# Patient Record
Sex: Male | Born: 1965 | Race: White | Hispanic: No | Marital: Married | Smoking: Never smoker
Health system: Southern US, Community
[De-identification: ages and names within clinical notes are randomized; demographics above are authoritative.]

## PROBLEM LIST (undated history)

## (undated) DIAGNOSIS — G35D Multiple sclerosis, unspecified: Secondary | ICD-10-CM

## (undated) DIAGNOSIS — N200 Calculus of kidney: Secondary | ICD-10-CM

## (undated) DIAGNOSIS — U071 COVID-19: Secondary | ICD-10-CM

## (undated) DIAGNOSIS — G35 Multiple sclerosis: Secondary | ICD-10-CM

## (undated) DIAGNOSIS — C4491 Basal cell carcinoma of skin, unspecified: Secondary | ICD-10-CM

## (undated) DIAGNOSIS — L570 Actinic keratosis: Secondary | ICD-10-CM

## (undated) DIAGNOSIS — H539 Unspecified visual disturbance: Secondary | ICD-10-CM

## (undated) HISTORY — DX: COVID-19: U07.1

## (undated) HISTORY — DX: Actinic keratosis: L57.0

## (undated) HISTORY — PX: CYSTOSCOPY WITH STENT PLACEMENT: SHX5790

## (undated) HISTORY — DX: Multiple sclerosis, unspecified: G35.D

## (undated) HISTORY — PX: LITHOTRIPSY: SUR834

## (undated) HISTORY — PX: VASECTOMY: SHX75

## (undated) HISTORY — DX: Basal cell carcinoma of skin, unspecified: C44.91

## (undated) HISTORY — DX: Calculus of kidney: N20.0

## (undated) HISTORY — DX: Unspecified visual disturbance: H53.9

## (undated) HISTORY — DX: Multiple sclerosis: G35

---

## 2013-11-03 ENCOUNTER — Ambulatory Visit: Payer: Self-pay | Admitting: Orthopedic Surgery

## 2013-11-06 DIAGNOSIS — M1990 Unspecified osteoarthritis, unspecified site: Secondary | ICD-10-CM | POA: Insufficient documentation

## 2013-11-06 DIAGNOSIS — N529 Male erectile dysfunction, unspecified: Secondary | ICD-10-CM | POA: Insufficient documentation

## 2013-11-25 ENCOUNTER — Ambulatory Visit: Payer: Self-pay | Admitting: Neurology

## 2013-12-11 DIAGNOSIS — M67411 Ganglion, right shoulder: Secondary | ICD-10-CM | POA: Insufficient documentation

## 2013-12-11 DIAGNOSIS — M6740 Ganglion, unspecified site: Secondary | ICD-10-CM | POA: Insufficient documentation

## 2014-02-04 ENCOUNTER — Encounter: Payer: Self-pay | Admitting: Podiatrist

## 2014-02-04 ENCOUNTER — Ambulatory Visit (INDEPENDENT_AMBULATORY_CARE_PROVIDER_SITE_OTHER): Payer: Managed Care, Other (non HMO) | Admitting: Podiatrist

## 2014-02-04 ENCOUNTER — Ambulatory Visit (INDEPENDENT_AMBULATORY_CARE_PROVIDER_SITE_OTHER): Payer: Managed Care, Other (non HMO)

## 2014-02-04 VITALS — BP 128/80 | HR 70 | Resp 16 | Ht 71.0 in | Wt 195.0 lb

## 2014-02-04 DIAGNOSIS — M216X2 Other acquired deformities of left foot: Secondary | ICD-10-CM

## 2014-02-04 DIAGNOSIS — M216X9 Other acquired deformities of unspecified foot: Secondary | ICD-10-CM

## 2014-02-04 DIAGNOSIS — M779 Enthesopathy, unspecified: Secondary | ICD-10-CM

## 2014-02-04 DIAGNOSIS — M778 Other enthesopathies, not elsewhere classified: Secondary | ICD-10-CM

## 2014-02-04 DIAGNOSIS — D3610 Benign neoplasm of peripheral nerves and autonomic nervous system, unspecified: Secondary | ICD-10-CM

## 2014-02-04 DIAGNOSIS — D219 Benign neoplasm of connective and other soft tissue, unspecified: Secondary | ICD-10-CM

## 2014-02-04 DIAGNOSIS — M7750 Other enthesopathy of unspecified foot: Secondary | ICD-10-CM

## 2014-02-04 MED ORDER — ETODOLAC 500 MG PO TABS: 500.0000 mg | ORAL_TABLET | Freq: Two times a day (BID) | ORAL | Status: DC

## 2014-02-04 NOTE — Patient Instructions (Signed)
Look up "Pre Dislocation Syndrome"-- this is what the likely diagnosis is for your foot-- also "capsulitis"  But it is a more broad search term and the results will probably be harder to get any good info from.  Keep that toe taped down and ice.    ICE INSTRUCTIONS  Apply ice or cold pack to the affected area at least 3 times a day for 10-15 minutes each time.  You should also use ice after prolonged activity or vigorous exercise.  Do not apply ice longer than 20 minutes at one time.  Always keep a cloth between your skin and the ice pack to prevent burns.  Being consistent and following these instructions will help control your symptoms.  We suggest you purchase a gel ice pack because they are reusable and do bit leak.  Some of them are designed to wrap around the area.  Use the method that works best for you.  Here are some other suggestions for icing.   Use a frozen bag of peas or corn-inexpensive and molds well to your body, usually stays frozen for 10 to 20 minutes.  Wet a towel with cold water and squeeze out the excess until it's damp.  Place in a bag in the freezer for 20 minutes. Then remove and use.

## 2014-02-04 NOTE — Progress Notes (Signed)
   Subjective:    Patient ID: Perry Gentry, male    DOB: 04/30/1966, 48 y.o.   MRN: [id number]  HPI Comments: i have a lump under my toes on my left foot. This has been going on for 3 months. Its about the same. i feel it when im walking and standing. The mornings are worse.  i took etodolac and it helped.   Foot Pain      Review of Systems  Musculoskeletal:       Joint pain  All other systems reviewed and are negative.      Objective:   Physical Exam Patient is awake, alert, and oriented x 3.  In no acute distress.  Vascular status is intact with palpable pedal pulses at 2/4 DP and PT bilateral and capillary refill time within normal limits. Neurological sensation is also intact bilaterally via Semmes Weinstein monofilament at 5/5 sites. Light touch, vibratory sensation, Achilles tendon reflex is intact. Dermatological exam reveals skin color, turger and texture as normal. No open lesions present.  Musculature intact with dorsiflexion, plantarflexion, inversion, eversion.  Pain on palpation plantar aspect of the left foot submetatarsal 2 and at the second digit is noted. Pain with dorsal pressure against the second toe at the metatarsal joint is also noted. Slight elevation of the second digit in comparison with the first and the third is noted. Hallux limitus rigidus is also noted left foot. Elongated second digit is also noted.     Assessment & Plan:  Capsulitis/pre-dislocation syndrome second metatarsophalangeal joint left  Plan: Discussed taping and padding the left foot as well as icing and staying off of the foot is much is a can while the area heels. He is an avid runner and I recommended decreasing his activity while the foot heels. If there is no improvement with conservative therapy in 2-3 weeks he will call and a injection will be considered. At this time we would like to hold off.

## 2014-05-27 ENCOUNTER — Ambulatory Visit (INDEPENDENT_AMBULATORY_CARE_PROVIDER_SITE_OTHER): Payer: Managed Care, Other (non HMO) | Admitting: Podiatry

## 2014-05-27 DIAGNOSIS — M25872 Other specified joint disorders, left ankle and foot: Secondary | ICD-10-CM

## 2014-05-27 DIAGNOSIS — M779 Enthesopathy, unspecified: Principal | ICD-10-CM

## 2014-05-27 DIAGNOSIS — M7752 Other enthesopathy of left foot: Secondary | ICD-10-CM

## 2014-05-27 DIAGNOSIS — M778 Other enthesopathies, not elsewhere classified: Secondary | ICD-10-CM

## 2014-05-27 NOTE — Patient Instructions (Signed)
Achilles Tendinitis   with Rehab  Achilles tendinitis is a disorder of the Achilles tendon. The Achilles tendon connects the large calf muscles (Gastrocnemius and Soleus) to the heel bone (calcaneus). This tendon is sometimes called the heel cord. It is important for pushing-off and standing on your toes and is important for walking, running, or jumping. Tendinitis is often caused by overuse and repetitive microtrauma.  SYMPTOMS  · Pain, tenderness, swelling, warmth, and redness may occur over the Achilles tendon even at rest.  · Pain with pushing off, or flexing or extending the ankle.  · Pain that is worsened after or during activity.  CAUSES   · Overuse sometimes seen with rapid increase in exercise programs or in sports requiring running and jumping.  · Poor physical conditioning (strength and flexibility or endurance).  · Running sports, especially training running down hills.  · Inadequate warm-up before practice or play or failure to stretch before participation.  · Injury to the tendon.  PREVENTION   · Warm up and stretch before practice or competition.  · Allow time for adequate rest and recovery between practices and competition.  · Keep up conditioning.  ¨ Keep up ankle and leg flexibility.  ¨ Improve or keep muscle strength and endurance.  ¨ Improve cardiovascular fitness.  · Use proper technique.  · Use proper equipment (shoes, skates).  · To help prevent recurrence, taping, protective strapping, or an adhesive bandage may be recommended for several weeks after healing is complete.  PROGNOSIS   · Recovery may take weeks to several months to heal.  · Longer recovery is expected if symptoms have been prolonged.  · Recovery is usually quicker if the inflammation is due to a direct blow as compared with overuse or sudden strain.  RELATED COMPLICATIONS   · Healing time will be prolonged if the condition is not correctly treated. The injury must be given plenty of time to heal.  · Symptoms can reoccur if  activity is resumed too soon.  · Untreated, tendinitis may increase the risk of tendon rupture requiring additional time for recovery and possibly surgery.  TREATMENT   · The first treatment consists of rest anti-inflammatory medication, and ice to relieve the pain.  · Stretching and strengthening exercises after resolution of pain will likely help reduce the risk of recurrence. Referral to a physical therapist or athletic trainer for further evaluation and treatment may be helpful.  · A walking boot or cast may be recommended to rest the Achilles tendon. This can help break the cycle of inflammation and microtrauma.  · Arch supports (orthotics) may be prescribed or recommended by your caregiver as an adjunct to therapy and rest.  · Surgery to remove the inflamed tendon lining or degenerated tendon tissue is rarely necessary and has shown less than predictable results.  MEDICATION   · Nonsteroidal anti-inflammatory medications, such as aspirin and ibuprofen, may be used for pain and inflammation relief. Do not take within 7 days before surgery. Take these as directed by your caregiver. Contact your caregiver immediately if any bleeding, stomach upset, or signs of allergic reaction occur. Other minor pain relievers, such as acetaminophen, may also be used.  · Pain relievers may be prescribed as necessary by your caregiver. Do not take prescription pain medication for longer than 4 to 7 days. Use only as directed and only as much as you need.  · Cortisone injections are rarely indicated. Cortisone injections may weaken tendons and predispose to rupture. It is better   to give the condition more time to heal than to use them.  HEAT AND COLD  · Cold is used to relieve pain and reduce inflammation for acute and chronic Achilles tendinitis. Cold should be applied for 10 to 15 minutes every 2 to 3 hours for inflammation and pain and immediately after any activity that aggravates your symptoms. Use ice packs or an ice  massage.  · Heat may be used before performing stretching and strengthening activities prescribed by your caregiver. Use a heat pack or a warm soak.  SEEK MEDICAL CARE IF:  · Symptoms get worse or do not improve in 2 weeks despite treatment.  · New, unexplained symptoms develop. Drugs used in treatment may produce side effects.  EXERCISES  RANGE OF MOTION (ROM) AND STRETCHING EXERCISES - Achilles Tendinitis   These exercises may help you when beginning to rehabilitate your injury. Your symptoms may resolve with or without further involvement from your physician, physical therapist or athletic trainer. While completing these exercises, remember:   · Restoring tissue flexibility helps normal motion to return to the joints. This allows healthier, less painful movement and activity.  · An effective stretch should be held for at least 30 seconds.  · A stretch should never be painful. You should only feel a gentle lengthening or release in the stretched tissue.  STRETCH - Gastroc, Standing   · Place hands on wall.  · Extend right / left leg, keeping the front knee somewhat bent.  · Slightly point your toes inward on your back foot.  · Keeping your right / left heel on the floor and your knee straight, shift your weight toward the wall, not allowing your back to arch.  · You should feel a gentle stretch in the right / left calf. Hold this position for __________ seconds.  Repeat __________ times. Complete this stretch __________ times per day.  STRETCH - Soleus, Standing   · Place hands on wall.  · Extend right / left leg, keeping the other knee somewhat bent.  · Slightly point your toes inward on your back foot.  · Keep your right / left heel on the floor, bend your back knee, and slightly shift your weight over the back leg so that you feel a gentle stretch deep in your back calf.  · Hold this position for __________ seconds.  Repeat __________ times. Complete this stretch __________ times per day.  STRETCH -  Gastrocsoleus, Standing   Note: This exercise can place a lot of stress on your foot and ankle. Please complete this exercise only if specifically instructed by your caregiver.   · Place the ball of your right / left foot on a step, keeping your other foot firmly on the same step.  · Hold on to the wall or a rail for balance.  · Slowly lift your other foot, allowing your body weight to press your heel down over the edge of the step.  · You should feel a stretch in your right / left calf.  · Hold this position for __________ seconds.  · Repeat this exercise with a slight bend in your knee.  Repeat __________ times. Complete this stretch __________ times per day.   STRENGTHENING EXERCISES - Achilles Tendinitis  These exercises may help you when beginning to rehabilitate your injury. They may resolve your symptoms with or without further involvement from your physician, physical therapist or athletic trainer. While completing these exercises, remember:   · Muscles can gain both the endurance   and the strength needed for everyday activities through controlled exercises.  · Complete these exercises as instructed by your physician, physical therapist or athletic trainer. Progress the resistance and repetitions only as guided.  · You may experience muscle soreness or fatigue, but the pain or discomfort you are trying to eliminate should never worsen during these exercises. If this pain does worsen, stop and make certain you are following the directions exactly. If the pain is still present after adjustments, discontinue the exercise until you can discuss the trouble with your clinician.  STRENGTH - Plantar-flexors   · Sit with your right / left leg extended. Holding onto both ends of a rubber exercise band/tubing, loop it around the ball of your foot. Keep a slight tension in the band.  · Slowly push your toes away from you, pointing them downward.  · Hold this position for __________ seconds. Return slowly, controlling the  tension in the band/tubing.  Repeat __________ times. Complete this exercise __________ times per day.   STRENGTH - Plantar-flexors   · Stand with your feet shoulder width apart. Steady yourself with a wall or table using as little support as needed.  · Keeping your weight evenly spread over the width of your feet, rise up on your toes.*  · Hold this position for __________ seconds.  Repeat __________ times. Complete this exercise __________ times per day.   *If this is too easy, shift your weight toward your right / left leg until you feel challenged. Ultimately, you may be asked to do this exercise with your right / left foot only.  STRENGTH - Plantar-flexors, Eccentric   Note: This exercise can place a lot of stress on your foot and ankle. Please complete this exercise only if specifically instructed by your caregiver.   · Place the balls of your feet on a step. With your hands, use only enough support from a wall or rail to keep your balance.  · Keep your knees straight and rise up on your toes.  · Slowly shift your weight entirely to your right / left toes and pick up your opposite foot. Gently and with controlled movement, lower your weight through your right / left foot so that your heel drops below the level of the step. You will feel a slight stretch in the back of your calf at the end position.  · Use the healthy leg to help rise up onto the balls of both feet, then lower weight only on the right / left leg again. Build up to 15 repetitions. Then progress to 3 consecutive sets of 15 repetitions.*  · After completing the above exercise, complete the same exercise with a slight knee bend (about 30 degrees). Again, build up to 15 repetitions. Then progress to 3 consecutive sets of 15 repetitions.*  Perform this exercise __________ times per day.   *When you easily complete 3 sets of 15, your physician, physical therapist or athletic trainer may advise you to add resistance by wearing a backpack filled with  additional weight.  STRENGTH - Plantar Flexors, Seated   · Sit on a chair that allows your feet to rest flat on the ground. If necessary, sit at the edge of the chair.  · Keeping your toes firmly on the ground, lift your right / left heel as far as you can without increasing any discomfort in your ankle.  Repeat __________ times. Complete this exercise __________ times a day.  *If instructed by your physician, physical therapist or athletic   trainer, you may add ____________________ of resistance by placing a weighted object on your right / left knee.  Document Released: 02/14/2005 Document Revised: 10/08/2011 Document Reviewed: 10/28/2008  [employer]® Patient Information ©2015 [employer]. This information is not intended to replace advice given to you by your health care provider. Make sure you discuss any questions you have with your health care provider.

## 2014-05-31 NOTE — Progress Notes (Signed)
Patient ID: Perry Gentry, male   DOB: 14-May-1966, 48 y.o.   MRN: [id number]  Subjective: Patient returns to the office today for follow-up evaluation of left foot second MTPJ capsulitis/pre-dislocation syndrome. He does status his last appointment has continue with padding as well as taping of the digit which provides some relief however he does continue to have some pain. He does ride on average 3-4 miles a couple times a week. He does not have pain in this area all the time or even after some runs. The pain is intermittent in nature. Denies any acute changes since last appointment. No other complaints at this time.  Objective: AAO x3, NAD DP/PT pulses palpable bilaterally, CRT less than 3 seconds Protective sensation intact with Simms Weinstein monofilament, vibratory sensation intact, Achilles tendon reflex intact Left foot second MTPJ mild tenderness to palpation along the plantar aspect. There is no pain with palpation or pain with vibratory sensation overlying the digit or the metatarsal. There is no overlying erythema, edema, increased warmth. There is mild hammertoe contracture of the left second digit. Equinus bilaterally. No open lesions or pre-ulcerative lesions. No calf pain, swelling, warmth, erythema  Assessment: 48 year old male with left second digit capsulitis/P dislocation syndrome.  Plan: -Conservative versus surgical treatment discussed including alternatives, risks, complications. -At this time we'll continue with conservative therapy. Again discussed how to tape the digit down as well as offloading pats the area. Also discussed shoe gear changes and possible orthotics. Discussed stretching exercises due to equinus to help take pressure off of the forefoot. -We'll consider possible graphite insert if symptoms persist. -Follow-up as needed or in 2-3 months. In the meantime call the office with any questions, concerns, change in symptoms.

## 2014-11-19 ENCOUNTER — Encounter: Payer: Self-pay | Admitting: Neurology

## 2014-11-19 ENCOUNTER — Ambulatory Visit (INDEPENDENT_AMBULATORY_CARE_PROVIDER_SITE_OTHER): Payer: Managed Care, Other (non HMO) | Admitting: Neurology

## 2014-11-19 VITALS — BP 128/80 | HR 78 | Resp 14 | Ht 71.0 in | Wt 207.6 lb

## 2014-11-19 DIAGNOSIS — R200 Anesthesia of skin: Secondary | ICD-10-CM | POA: Insufficient documentation

## 2014-11-19 DIAGNOSIS — G35 Multiple sclerosis: Secondary | ICD-10-CM | POA: Diagnosis not present

## 2014-11-19 DIAGNOSIS — R202 Paresthesia of skin: Secondary | ICD-10-CM | POA: Diagnosis not present

## 2014-11-19 DIAGNOSIS — Z79899 Other long term (current) drug therapy: Secondary | ICD-10-CM | POA: Insufficient documentation

## 2014-11-19 NOTE — Progress Notes (Addendum)
[facility]  PATIENT: Perry Gentry DOB: [birth year]  REFERRING DOCTOR OR PCP:  [place] Pink SOURCE: patient and records  _________________________________   HISTORICAL  CHIEF COMPLAINT:  Chief Complaint  Patient presents with  . Multiple Sclerosis    Sts. dx. in 2015.  Presenting sx. was numbness in fingertips.  Sts. he was eval by his pcp and chiropractor, and was referred to [facility].  There, he had an mri without contrast that was abnormal, and so a month later had mri brain and c-spine with contrast.  Sts. he was told  he had lesions in his brain and c-spine.  Sts. he was started on Tecfidera and tolerates it well--denies progression of sx. since starting [place].  Sts. still has numbness in his fingertips/fim    HISTORY OF PRESENT ILLNESS:  I had the pleasure seeing you patient, Perry Gentry, at [facility] for neurologic consultation regarding his recent diagnosis of multiple sclerosis. He is a right handed 49 year old man who had noted numbness in his hands since the middle of 2014. Sometimes, he would drop items.  There was not any pain nor any weakness. However, in 2010 he noted that when he would bend his neck forward he would have a zinging sensation going down from the spine to both of his hands. Over a couple years this improved.   In his 87s, he had visual changes in the left eye where he felt that was a veil over his vision.   Colors are desaturated out of the left eye.. . He saw orthopedics for his hand numbness in 2014 and had an MRI of the cervical spine. The MRI showed plaque at the C2-C3 region consistent with multiple sclerosis. It also showed degenerative changes at C6-C7 with severe bilateral foraminal stenosis. A subsequent MRI of the brain showed several white matter lesions (2 periventricular, 1-2 cortical foci) could be consistent with multiple sclerosis. No abnormal enhancement.      He was referred  to Dr. Manuella Ghazi in [place] who diagnosed him with multiple sclerosis.   He was started on Tecfidera.  He tolerates Tecfidera well and denies significant flushing or GI upset.    He has some flushing if he takes on an empty stomach.  He denies any exacerbation or increasing disability since starting Tecfidera.  Gait/strength/sensation:    He continues to have numbness in his fingers. The numbness is mostly a tingling and he feels he still gets fair sensation in the fingertips. He denies any weakness in his hands or legs. There is no numbness in the legs. He walks well and is able to run several times a week without difficulty. He does not note any imbalance with his eyes closed.  Bladder/bowel: Bowel and bladder function is fine.  Vision:    Since his mid 53s, he has noted decreased color saturation and a little bit of a "veil" over his left eye vision, despite good acuity  Fatigue/sleep: He does note some physical fatigue, especially later in the day. He feels that he sleeps well most nights but not every night. Some nights he is more restless and will wake up after a few hours of sleep and then have trouble staying asleep. Most mornings when he wakes up he feels refreshed. He has lost about 30 pounds and his snoring is much less. His wife has never commented to him about pauses or gasping at night  Mood/cognition: He denies any depression or anxiety. 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 He does a lot of traveling for his job.    REVIEW OF SYSTEMS: Constitutional: No fevers, chills, sweats, or change in .   He notes some fatigue Eyes: No visual changes, double vision, eye pain Ear, nose and throat: No hearing loss, ear pain, nasal congestion, sore throat Cardiovascular: No chest pain, palpitations Respiratory: No shortness of breath at rest or with exertion.   No wheezes GastrointestinaI: No nausea, vomiting, diarrhea, abdominal pain, fecal  incontinence Genitourinary: No dysuria, urinary retention or frequency.  No nocturia. Musculoskeletal: No neck pain, back pain Integumentary: No rash, pruritus, skin lesions Neurological: as above Psychiatric: No depression at this time.  No anxiety Endocrine: No palpitations, diaphoresis, change in appetite, change in weigh or increased thirst Hematologic/Lymphatic: No anemia, purpura, petechiae. Allergic/Immunologic: No itchy/runny eyes, nasal congestion, recent allergic reactions, rashes  ALLERGIES: No Known Allergies  HOME MEDICATIONS:  Current outpatient prescriptions:  .  cholecalciferol (VITAMIN D) 1000 UNITS tablet, Take 1,000 Units by mouth daily., Disp: , Rfl:  .  TECFIDERA 240 MG CPDR, Take 240 mg by mouth 2 (two) times daily., Disp: , Rfl:   PAST MEDICAL HISTORY: Past Medical History  Diagnosis Date  . Multiple sclerosis   . Vision abnormalities   . Kidney stones     PAST SURGICAL HISTORY: Past Surgical History  Procedure Laterality Date  . Cystoscopy with stent placement    . Lithotripsy      FAMILY HISTORY: Family History  Problem Relation Age of Onset  . Hypertension Mother   . Hypertension Father     SOCIAL HISTORY:  History   Social History  . Marital Status: Married    Spouse Name: N/A  . Number of Children: N/A  . Years of Education: N/A   Occupational History  . Not on file.   Social History Main Topics  . Smoking status: Never Smoker   . Smokeless tobacco: Not on file  . Alcohol Use: Yes  . Drug Use: Not on file  . Sexual Activity: Not on file   Other Topics Concern  . Not on file   Social History Narrative     PHYSICAL EXAM  Filed Vitals:   11/19/14 0933  BP: 128/80  Pulse: 78  Resp: 14  Height: 5\' 11"  (1.803 m)  Weight: 207 lb 9.6 oz (94.167 kg)    Body mass index is 28.97 kg/(m^2).   General: The patient is well-developed and well-nourished and in no acute distress  Eyes:  Funduscopic exam shows normal  optic discs and retinal vessels.  Neck: The neck is supple, no carotid bruits are noted.  The neck is nontender.  Cardiovascular: The heart has a regular rate and rhythm with a normal S1 and S2. There were no murmurs, gallops or rubs. Lungs are clear to auscultation.  Skin: Extremities are without significant edema.  Musculoskeletal:  Back is nontender  Neurologic Exam  Mental status: The patient is alert and oriented x 3 at the time of the examination. The patient has apparent normal recent and remote memory, with an apparently normal attention span and concentration ability.   Speech is normal.  Cranial nerves: Extraocular movements are full. Pupils are equal, round, and reactive to light and accomodation.  Visual fields are full.  Color saturation is reduced out of the left eye. Facial symmetry is present. There is good facial sensation to soft touch bilaterally.Facial strength is normal.  Trapezius and sternocleidomastoid strength is normal. No dysarthria  is noted.  The tongue is midline, and the patient has symmetric elevation of the soft palate. No obvious hearing deficits are noted.  Motor:  Muscle bulk is normal.   Tone is normal. Strength is  5 / 5 in all 4 extremities.   Sensory: Sensory testing is intact to pinprick, soft touch and vibration sensation in all 4 extremities.  Coordination: Cerebellar testing reveals good finger-nose-finger and heel-to-shin bilaterally.  Gait and station: Station is normal.   Gait is normal. Tandem gait is normal. Romberg is negative.   Reflexes: Deep tendon reflexes are symmetric and normal bilaterally (2 in arms, 1 in legs).   Plantar responses are flexor.    DIAGNOSTIC DATA (LABS, IMAGING, TESTING) - I reviewed patient records, labs, notes, testing and imaging myself where available.    ASSESSMENT AND PLAN  Multiple sclerosis - Plan: CBC with Differential/Platelet  High risk medication use - Plan: CBC with  Differential/Platelet  Paresthesia of both hands   Perry Gentry is a 49 year old man diagnosed with multiple sclerosis in early 2015 but has had symptoms since for many years earlier. He appears to be doing well on Tecfidera with good tolerability and no clinical relapses. We discussed the risk of PML and the need to check the lymphocyte counts every 6 months. We will check a blood count on his way out. I will also check an MRI of the brain with and without contrast to assess for the possibility of subclinical progression if this is occurring, we will need to consider a different disease modifying medication for his MS.    Currently he is doing well with just mild symptoms of years and fatigue. All additional treatment is needed at this time. We discussed prognosis which is good as he has an EDSS of 0 about 5 years after his more significant symptoms started.  He will return to see me in 6 months if stable but call sooner if he has any significant new or worsening neurologic symptoms   Richard A. Felecia Shelling, MD, PhD 0/67/7034, 0:35 AM Certified in Neurology, Clinical Neurophysiology, Sleep Medicine, Pain Medicine and Neuroimaging  [facility] [address], [place], [place] [address] [phone]

## 2014-11-19 NOTE — Addendum Note (Signed)
Addended by: Arlice Colt A on: 11/19/2014 10:24 AM   Modules accepted: Orders

## 2014-11-20 LAB — CBC WITH DIFFERENTIAL/PLATELET
BASOS ABS: 0 10*3/uL (ref 0.0–0.2)
Basos: 0 %
EOS (ABSOLUTE): 0.2 10*3/uL (ref 0.0–0.4)
Eos: 2 %
HEMATOCRIT: 42.9 % (ref 37.5–51.0)
Hemoglobin: 15 g/dL (ref 12.6–17.7)
Immature Grans (Abs): 0 10*3/uL (ref 0.0–0.1)
Immature Granulocytes: 0 %
LYMPHS ABS: 2.3 10*3/uL (ref 0.7–3.1)
Lymphs: 32 %
MCH: 29.5 pg (ref 26.6–33.0)
MCHC: 35 g/dL (ref 31.5–35.7)
MCV: 84 fL (ref 79–97)
MONOCYTES: 5 %
MONOS ABS: 0.4 10*3/uL (ref 0.1–0.9)
Neutrophils Absolute: 4.4 10*3/uL (ref 1.4–7.0)
Neutrophils: 61 %
PLATELETS: 162 10*3/uL (ref 150–379)
RBC: 5.08 x10E6/uL (ref 4.14–5.80)
RDW: 13.1 % (ref 12.3–15.4)
WBC: 7.3 10*3/uL (ref 3.4–10.8)

## 2014-12-24 ENCOUNTER — Ambulatory Visit
Admission: RE | Admit: 2014-12-24 | Discharge: 2014-12-24 | Disposition: A | Discharge status: Home / Self Care | Payer: Managed Care, Other (non HMO) | Source: Ambulatory Visit | Attending: Neurology | Admitting: Neurology

## 2014-12-24 DIAGNOSIS — R202 Paresthesia of skin: Secondary | ICD-10-CM

## 2014-12-24 DIAGNOSIS — G35 Multiple sclerosis: Secondary | ICD-10-CM

## 2014-12-24 MED ORDER — GADOBENATE DIMEGLUMINE 529 MG/ML IV SOLN
19.0000 mL | Freq: Once | INTRAVENOUS | Status: AC | PRN
  Administered 2014-12-24: 19 mL via INTRAVENOUS

## 2014-12-28 ENCOUNTER — Telehealth: Payer: Self-pay

## 2014-12-28 NOTE — Telephone Encounter (Signed)
Patient was informed that his MRI looks good --  No new lesions.  He does not have any questions at this time.

## 2015-02-17 ENCOUNTER — Other Ambulatory Visit: Payer: Self-pay

## 2015-02-17 MED ORDER — TECFIDERA 240 MG PO CPDR: 240.0000 mg | DELAYED_RELEASE_CAPSULE | Freq: Two times a day (BID) | ORAL | Status: DC

## 2015-05-23 ENCOUNTER — Ambulatory Visit: Payer: Managed Care, Other (non HMO) | Admitting: Neurology

## 2015-06-03 ENCOUNTER — Ambulatory Visit (INDEPENDENT_AMBULATORY_CARE_PROVIDER_SITE_OTHER): Payer: Managed Care, Other (non HMO) | Admitting: Neurology

## 2015-06-03 ENCOUNTER — Encounter: Payer: Self-pay | Admitting: Neurology

## 2015-06-03 VITALS — BP 138/84 | HR 66 | Resp 14 | Ht 71.0 in | Wt 206.2 lb

## 2015-06-03 DIAGNOSIS — G4700 Insomnia, unspecified: Secondary | ICD-10-CM | POA: Diagnosis not present

## 2015-06-03 DIAGNOSIS — Z79899 Other long term (current) drug therapy: Secondary | ICD-10-CM | POA: Diagnosis not present

## 2015-06-03 DIAGNOSIS — R202 Paresthesia of skin: Secondary | ICD-10-CM

## 2015-06-03 DIAGNOSIS — G35 Multiple sclerosis: Secondary | ICD-10-CM | POA: Diagnosis not present

## 2015-06-03 MED ORDER — DOXEPIN HCL 10 MG PO CAPS: 10.0000 mg | ORAL_CAPSULE | Freq: Every day | ORAL | Status: DC

## 2015-06-03 NOTE — Progress Notes (Signed)
[facility]  PATIENT: Perry Gentry DOB: Apr 12, 1966  REFERRING DOCTOR OR PCP:  [place] Pink SOURCE: patient and records  _________________________________   HISTORICAL  CHIEF COMPLAINT:  Chief Complaint  Patient presents with  . Multiple Sclerosis    Sts. he continues to tolerate Tecfidera well.  Recent mri brain showed no new lesions.  Sts. numbness in fingertips is worsening, now both hands are numb as well./fim    HISTORY OF PRESENT ILLNESS:  Perry Gentry is a right handed 49 year old man with MS diagnosed in 2014.  However, symptoms were present c/w MS x many years.      He has been on Tecfidera.   He tolerates Tecfidera well and denies significant flushing or GI upset.    He has some flushing if he takes on an empty stomach.  He denies any exacerbation or increasing disability since starting Tecfidera.  MS History:    In his 65s, he had visual changes in the left eye where he felt that was a veil over his vision.   Colors are still desaturated out of the left eye.   In 2010 he noted that when he would bend his neck forward he would have a zinging sensation going down from the spine to both of his hands. Over a couple years this improved.  He was noted numbness in his hands since the middle of 2014. Sometimes, he would drop items.  There was not any pain nor any weakness.  [name] He saw orthopedics for his hand numbness in 2014 and had an MRI of the cervical spine. The MRI showed plaque at the C2-C3 region consistent with multiple sclerosis. It also showed degenerative changes at C6-C7 with severe bilateral foraminal stenosis. A subsequent MRI of the brain showed several white matter lesions (2 periventricular, 1-2 cortical foci)  consistent with multiple sclerosis. No abnormal enhancement.   He was referred to Dr. Manuella Ghazi in [place] who diagnosed him with multiple sclerosis.   He was started on Tecfidera.   Gait/strength/sensation:   He walks well and is  able to run several times a week without difficulty. He lifts weights once or twice a week.   He does not note any imbalance with his eyes closed. He has noted numbness that was only in the fingertips last year is now  Involving the hands.   His hands are also sometimes stiff.    Tingling does not change depending on position.   No grip weakness.  He denies any weakness or numbness in his hands or legs.   Bladder/bowel: Bowel function is fine.   Prostate is enlarged and he sometimes has urinary urgency.   Ho hesitancy  Vision:    Since his mid 29s, he has had decreased color saturation and he sees a  "veil" over his left eye vision, despite good acuity  Fatigue/sleep: He feels energy is good most days.    He feels that he sleeps well most nights but not every night. Some nights he falls asleep but then wakes up and has trouble going back to sleep.   He is not always refreshed in the morning.    He snores some.   His wife has never commented to him about pauses or gasping at night.      Mood/cognition: He denies any depression or anxiety. 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    REVIEW OF SYSTEMS: Constitutional: No fevers, chills, sweats, or change in .  He notes some fatigue Eyes: No visual changes, double vision, eye pain Ear, nose and throat: No hearing loss, ear pain, nasal congestion, sore throat Cardiovascular: No chest pain, palpitations Respiratory: No shortness of breath at rest or with exertion.   No wheezes GastrointestinaI: No nausea, vomiting, diarrhea, abdominal pain, fecal incontinence Genitourinary: No dysuria, urinary retention or frequency.  No nocturia. Musculoskeletal: No neck pain, back pain Integumentary: No rash, pruritus, skin lesions Neurological: as above Psychiatric: No depression at this time.  No anxiety Endocrine: No palpitations, diaphoresis, change in appetite, change in weigh or increased  thirst Hematologic/Lymphatic: No anemia, purpura, petechiae. Allergic/Immunologic: No itchy/runny eyes, nasal congestion, recent allergic reactions, rashes  ALLERGIES: No Known Allergies  HOME MEDICATIONS:  Current outpatient prescriptions:  .  cholecalciferol (VITAMIN D) 1000 UNITS tablet, Take 1,000 Units by mouth daily., Disp: , Rfl:  .  TECFIDERA 240 MG CPDR, Take 1 capsule (240 mg total) by mouth 2 (two) times daily., Disp: 60 capsule, Rfl: 6  PAST MEDICAL HISTORY: Past Medical History  Diagnosis Date  . Multiple sclerosis ([place])   . Vision abnormalities   . Kidney stones     PAST SURGICAL HISTORY: Past Surgical History  Procedure Laterality Date  . Cystoscopy with stent placement    . Lithotripsy      FAMILY HISTORY: Family History  Problem Relation Age of Onset  . Hypertension Mother   . Hypertension Father     SOCIAL HISTORY:  Social History   Social History  . Marital Status: Married    Spouse Name: N/A  . Number of Children: N/A  . Years of Education: N/A   Occupational History  . Not on file.   Social History Main Topics  . Smoking status: Never Smoker   . Smokeless tobacco: Not on file  . Alcohol Use: Yes  . Drug Use: Not on file  . Sexual Activity: Not on file   Other Topics Concern  . Not on file   Social History Narrative     PHYSICAL EXAM  Filed Vitals:   06/03/15 0855  BP: 138/84  Pulse: 66  Resp: 14  Height: 5\' 11"  (1.803 m)  Weight: 206 lb 3.2 oz (93.532 kg)    Body mass index is 28.77 kg/(m^2).   General: The patient is well-developed and well-nourished and in no acute distress   Neck: The neck is supple .  The neck is nontender.      Neurologic Exam  Mental status: The patient is alert and oriented x 3 at the time of the examination. The patient has apparent normal recent and remote memory, with an apparently normal attention span and concentration ability.   Speech is normal.  Cranial nerves: Extraocular  movements are full. Pupils are equal, round, and reactive to light and accomodation.  Visual fields are full.  Color saturation is reduced out of the left eye. Facial symmetry is present. There is good facial sensation to soft touch bilaterally.Facial strength is normal.  Trapezius and sternocleidomastoid strength is normal. No dysarthria is noted.  The tongue is midline, and the patient has symmetric elevation of the soft palate. No obvious hearing deficits are noted.  Motor:  Muscle bulk is normal.   Tone is normal. Strength is  5 / 5 in all 4 extremities.   Sensory: Sensory testing is intact to pinprick, soft touch and vibration sensation in all 4 extremities.  Coordination: Cerebellar testing reveals good finger-nose-finger and heel-to-shin bilaterally.  Gait and station: Station is normal.  Gait is normal. Tandem gait is normal. Romberg is negative.   Reflexes: Deep tendon reflexes are symmetric and normal bilaterally (2 in arms, 1 in legs).   Plantar responses are flexor.  Other:    There were no Phalen's or Tinel's signs at the wrists    DIAGNOSTIC DATA (LABS, IMAGING, TESTING) - I reviewed patient records, labs, notes, testing and imaging myself where available.    ASSESSMENT AND PLAN  Multiple sclerosis (H[facility])  High risk medication use  Paresthesia of both hands   1.    Continue Tecfidera. We discussed the mild risk of PML that is increased if her lymphocyte counts are low. We will check a CBC with differential today. 2,     If the hand pain worsens, he will use splints at night. If they worsen significantly, I will also check a NCV/EMG. 3.  He will return to see me in 6 months if stable but call sooner if he has any significant new or worsening neurologic symptoms   Vincen Bejar A. Felecia Shelling, MD, PhD 75/10/4918, 1:00 AM Certified in Neurology, Clinical Neurophysiology, Sleep Medicine, Pain Medicine and Neuroimaging  [facility] [address] [place], [place] [address] [phone]

## 2015-06-04 LAB — CBC WITH DIFFERENTIAL/PLATELET
BASOS: 0 %
Basophils Absolute: 0 10*3/uL (ref 0.0–0.2)
EOS (ABSOLUTE): 0.2 10*3/uL (ref 0.0–0.4)
EOS: 2 %
HEMOGLOBIN: 14.9 g/dL (ref 12.6–17.7)
Hematocrit: 44.2 % (ref 37.5–51.0)
Immature Grans (Abs): 0 10*3/uL (ref 0.0–0.1)
Immature Granulocytes: 0 %
LYMPHS ABS: 2.2 10*3/uL (ref 0.7–3.1)
Lymphs: 31 %
MCH: 29.3 pg (ref 26.6–33.0)
MCHC: 33.7 g/dL (ref 31.5–35.7)
MCV: 87 fL (ref 79–97)
MONOCYTES: 6 %
MONOS ABS: 0.5 10*3/uL (ref 0.1–0.9)
Neutrophils Absolute: 4.3 10*3/uL (ref 1.4–7.0)
Neutrophils: 61 %
Platelets: 176 10*3/uL (ref 150–379)
RBC: 5.09 x10E6/uL (ref 4.14–5.80)
RDW: 13.4 % (ref 12.3–15.4)
WBC: 7.2 10*3/uL (ref 3.4–10.8)

## 2015-06-06 ENCOUNTER — Telehealth: Payer: Self-pay | Admitting: *Deleted

## 2015-06-06 NOTE — Telephone Encounter (Signed)
I have spoken with Masao and per RAS, advised that labs look ok.  He verbalized understanding of same/fim

## 2015-10-17 ENCOUNTER — Telehealth: Payer: Self-pay | Admitting: Neurology

## 2015-10-17 NOTE — Telephone Encounter (Signed)
I have spoken with Perry Gentry--I need a copy of his ins. card.  He sts. he will email it to me at my  email address/fim

## 2015-10-17 NOTE — Telephone Encounter (Signed)
Tecfidera PA form completed and faxed to Pharmacy services fax # [phone]

## 2015-10-17 NOTE — Telephone Encounter (Signed)
Pt is requesting that PA be called in to [phone] so the PA will go faster. Pt is not out of medication.

## 2015-10-17 NOTE — Telephone Encounter (Signed)
Patient called to advise, new insurance [employer] needs Prior Auth for [place], Fax# [phone].

## 2015-10-18 ENCOUNTER — Encounter: Payer: Self-pay | Admitting: *Deleted

## 2015-10-24 ENCOUNTER — Telehealth: Payer: Self-pay | Admitting: Neurology

## 2015-10-24 MED ORDER — TECFIDERA 240 MG PO CPDR: 240.0000 mg | DELAYED_RELEASE_CAPSULE | Freq: Two times a day (BID) | ORAL | Status: DC

## 2015-10-24 NOTE — Telephone Encounter (Signed)
Patient is calling and states his Perry Gentry has been approved by the insurance company so he now need a prescription for the medication sent to [facility] @[phone] opt 2. Please call him when complete.  Thanks!

## 2015-10-24 NOTE — Telephone Encounter (Signed)
Rx. escribed to [facility].  I have spoken with Harriet and let him know this has been done/fim

## 2015-10-24 NOTE — Addendum Note (Signed)
Addended by: France Ravens I on: 10/24/2015 05:50 PM   Modules accepted: Orders

## 2015-11-30 ENCOUNTER — Telehealth: Payer: Self-pay | Admitting: Neurology

## 2015-11-30 NOTE — Telephone Encounter (Signed)
Patient called and CX his apt. In May with Dr. Felecia Shelling .Patient wants apt in June towards first part of month if he something available.  Please leave apt time on his voice mail.

## 2015-11-30 NOTE — Telephone Encounter (Signed)
LMOM (identified vm) that appt. has been sched. for Thursday, June 1st at 0900, arrival time of 0845.  If this day/time are not good for him, he may call the office to r/s/fim

## 2015-12-02 ENCOUNTER — Ambulatory Visit: Payer: Managed Care, Other (non HMO) | Admitting: Neurology

## 2015-12-27 ENCOUNTER — Telehealth: Payer: Self-pay | Admitting: Neurology

## 2015-12-27 NOTE — Telephone Encounter (Signed)
Message For: OFFICE               Taken 30-MAY-17 at  1:03PM by [facility] ------------------------------------------------------------  Cathie Hoops          CID  [id number]   Patient  SAME - WILL C/B       Pt's Dr  Felecia Shelling         Area Code  [id number]  Phone#  [phone] [place]  [phone]      RE  CANCELLED 6/1 @ 9AM-WANTS [place] , [place], NO SRVC.        Disp:Y/N  N  If Y = C/B If No Response In 63minutes  ============================================================

## 2015-12-29 ENCOUNTER — Telehealth: Payer: Self-pay

## 2015-12-29 ENCOUNTER — Ambulatory Visit: Payer: Self-pay | Admitting: Neurology

## 2015-12-29 NOTE — Telephone Encounter (Signed)
Pt no showed f/u appt today.  

## 2015-12-30 ENCOUNTER — Encounter: Payer: Self-pay | Admitting: Neurology

## 2016-01-04 NOTE — Telephone Encounter (Signed)
Pt called this afternoon to complain about receiving a No Show letter. He states he actually called several times and several days before hand each time leaving messages with our answer phone service. I do not see any note left in pts chart indicating we took the message either. I have had some concerns regarding the answer service and I'm requesting the no show be taken off pts record. Pt does have appt with Dr. Jannifer [place] tomorrow as well.

## 2016-01-04 NOTE — Telephone Encounter (Signed)
Noted.  Pt's appt. tomorrow (01-05-16) is with Dr. Felecia Shelling, not Dr. Anabel Bene

## 2016-01-05 ENCOUNTER — Ambulatory Visit (INDEPENDENT_AMBULATORY_CARE_PROVIDER_SITE_OTHER): Payer: Managed Care, Other (non HMO) | Admitting: Neurology

## 2016-01-05 ENCOUNTER — Encounter: Payer: Self-pay | Admitting: Neurology

## 2016-01-05 VITALS — BP 145/80 | HR 63 | Ht 71.0 in | Wt 207.0 lb

## 2016-01-05 DIAGNOSIS — G35 Multiple sclerosis: Secondary | ICD-10-CM

## 2016-01-05 DIAGNOSIS — R202 Paresthesia of skin: Secondary | ICD-10-CM | POA: Diagnosis not present

## 2016-01-05 DIAGNOSIS — F32A Depression, unspecified: Secondary | ICD-10-CM

## 2016-01-05 DIAGNOSIS — N529 Male erectile dysfunction, unspecified: Secondary | ICD-10-CM | POA: Diagnosis not present

## 2016-01-05 DIAGNOSIS — R5383 Other fatigue: Secondary | ICD-10-CM

## 2016-01-05 DIAGNOSIS — G4700 Insomnia, unspecified: Secondary | ICD-10-CM | POA: Diagnosis not present

## 2016-01-05 DIAGNOSIS — Z79899 Other long term (current) drug therapy: Secondary | ICD-10-CM

## 2016-01-05 DIAGNOSIS — F329 Major depressive disorder, single episode, unspecified: Secondary | ICD-10-CM | POA: Diagnosis not present

## 2016-01-05 MED ORDER — BUPROPION HCL ER (XL) 300 MG PO TB24: 300.0000 mg | ORAL_TABLET | Freq: Every day | ORAL | Status: DC

## 2016-01-05 NOTE — Progress Notes (Signed)
[facility]  PATIENT: Perry Gentry DOB: [birth year]  REFERRING DOCTOR OR PCP:  [place] Pink SOURCE: patient and records  _________________________________   HISTORICAL  CHIEF COMPLAINT:  Chief Complaint  Patient presents with  . Multiple Sclerosis    He is taking his Tecfidera, as prescribed.  He still has diffculty sleeping through the night, especially when he travels.  He rarely takes Doxepin because it causes increased somnolence the following morning.  Reports the tingling/numbness that started in his fingers have now moved into his hands.    HISTORY OF PRESENT ILLNESS:  Perry Gentry is a right handed 50 year old man with MS diagnosed in 2014.  His first symptoms actually occurred 5 years ago when he had probable optic neuritis.  He has been on Tecfidera.   He tolerates Tecfidera well and denies significant flushing or GI upset.    He has some flushing if he takes on an empty stomach.  He denies any definite exacerbation but has noted more numbness in the hands and palms.   Hand Numbness:   He reports bilateral hand numbness, mostly in the middle three fingertips..    He doe snot have Phalen's or Tinel's sings but has awoken a few times with numbness in his hands, once severe enough that he could not move his wrist x several minutes.   MRI of the cervical spine 2015 [place] shows a spinal cord plaque at C3-C4 that is central and slightly to the left.  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 He lifts weights once or twice a week.   He does not note any imbalance with his eyes closed. He has noted numbness that was only in the fingertips last year is now  Involving the hands.   His hands are also sometimes stiff.    Tingling does not change depending on position.   No grip weakness.  He denies any weakness or  numbness in his hands or legs.   Bladder/bowel: Bowel function is fine.   Prostate is enlarged and he sometimes has urinary urgency.   Ho hesitancy .  He has noted decreased libido and EGD.  Vision:    Since his mid 65s, he has had decreased color saturation and he sees a  "veil" over his left eye vision, despite good acuity  Fatigue/sleep: He feels energy is good most days.    He feels that he sleeps well most nights but not every night. Some nights he falls asleep but then wakes up and has trouble going back to sleep.   He is not always refreshed in the morning.    He snores some.   His wife has never commented to him about pauses or gasping at night.      Mood/cognition: He denies any history depression or anxiety but feels more apathetic and slightly down the past few months.   He has been told he is more irritable also. 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   MS History:    In his 42s, he had visual changes in the left eye where he felt that was a veil over his vision.   Colors are still desaturated out of the left eye.   In 2010 he noted that when he would bend his neck forward he would have a zinging  sensation going down from the spine to both of his hands. Over a couple years this improved.  He was noted numbness in his hands since the middle of 2014. Sometimes, he would drop items.  There was not any pain nor any weakness.  [name] He saw orthopedics for his hand numbness in 2014 and had an MRI of the cervical spine. The MRI showed plaque at the C2-C3 region consistent with multiple sclerosis. It also showed degenerative changes at C6-C7 with severe bilateral foraminal stenosis. A subsequent MRI of the brain showed several white matter lesions (2 periventricular, 1-2 cortical foci)  consistent with multiple sclerosis. No abnormal enhancement.   He was referred to Dr. Manuella Ghazi in [place] who diagnosed him with multiple sclerosis.   He was started on  Tecfidera.     REVIEW OF SYSTEMS: Constitutional: No fevers, chills, sweats, or change in .   He notes some fatigue Eyes: No visual changes, double vision, eye pain Ear, nose and throat: No hearing loss, ear pain, nasal congestion, sore throat Cardiovascular: No chest pain, palpitations Respiratory: No shortness of breath at rest or with exertion.   No wheezes GastrointestinaI: No nausea, vomiting, diarrhea, abdominal pain, fecal incontinence Genitourinary: No dysuria, urinary retention or frequency.  No nocturia.   He has ED and decreased libido Musculoskeletal: No neck pain, back pain Integumentary: No rash, pruritus, skin lesions Neurological: as above Psychiatric: as above Endocrine: No palpitations, diaphoresis, change in appetite, change in weigh or increased thirst Hematologic/Lymphatic: No anemia, purpura, petechiae. Allergic/Immunologic: No itchy/runny eyes, nasal congestion, recent allergic reactions, rashes  ALLERGIES: No Known Allergies  HOME MEDICATIONS:  Current outpatient prescriptions:  .  cholecalciferol (VITAMIN D) 1000 UNITS tablet, Take 1,000 Units by mouth daily., Disp: , Rfl:  .  doxepin (SINEQUAN) 10 MG capsule, Take 1 capsule (10 mg total) by mouth at bedtime., Disp: 90 capsule, Rfl: 3 .  TECFIDERA 240 MG CPDR, Take 1 capsule (240 mg total) by mouth 2 (two) times daily., Disp: 180 capsule, Rfl: 3  PAST MEDICAL HISTORY: Past Medical History  Diagnosis Date  . Multiple sclerosis ([place])   . Vision abnormalities   . Kidney stones     PAST SURGICAL HISTORY: Past Surgical History  Procedure Laterality Date  . Cystoscopy with stent placement    . Lithotripsy      FAMILY HISTORY: Family History  Problem Relation Age of Onset  . Hypertension Mother   . Hypertension Father     SOCIAL HISTORY:  Social History   Social History  . Marital Status: Married    Spouse Name: N/A  . Number of Children: N/A  . Years of Education: N/A   Occupational  History  . Not on file.   Social History Main Topics  . Smoking status: Never Smoker   . Smokeless tobacco: Not on file  . Alcohol Use: Yes  . Drug Use: Not on file  . Sexual Activity: Not on file   Other Topics Concern  . Not on file   Social History Narrative     PHYSICAL EXAM  Filed Vitals:   01/05/16 1057  BP: 145/80  Pulse: 63  Height: 5\' 11"  (1.803 m)  Weight: 207 lb (93.895 kg)    Body mass index is 28.88 kg/(m^2).   General: The patient is well-developed and well-nourished and in no acute distress   Neck: The neck is supple .  The neck is nontender.      Neurologic Exam  Mental status: The patient is  alert and oriented x 3 at the time of the examination. The patient has apparent normal recent and remote memory, with an apparently normal attention span and concentration ability.   Speech is normal.  Cranial nerves: Extraocular movements are full. Pupils are equal, round, and reactive to light and accomodation.  Visual fields are full.  Color saturation is reduced out of the left eye. Facial symmetry is present. There is good facial sensation to soft touch bilaterally.Facial strength is normal.  Trapezius and sternocleidomastoid strength is normal. No dysarthria is noted.  The tongue is midline, and the patient has symmetric elevation of the soft palate. No obvious hearing deficits are noted.  Motor:  Muscle bulk is normal.   Tone is normal. Strength is  5 / 5 in all 4 extremities.   Sensory: Sensory testing is intact to pinprick, soft touch and vibration sensation in all 4 extremities.  Coordination: Cerebellar testing reveals good finger-nose-finger and heel-to-shin bilaterally.  Gait and station: Station is normal.   Gait is normal. Tandem gait is normal. Romberg is negative.   Reflexes: Deep tendon reflexes are symmetric and normal bilaterally (2 in arms, 1 in legs).   Plantar responses are flexor.  Other:    There were no Phalen's or Tinel's signs at the  wrists    DIAGNOSTIC DATA (LABS, IMAGING, TESTING) - I reviewed patient records, labs, notes, testing and imaging myself where available.    ASSESSMENT AND PLAN  Multiple sclerosis ([place]) - Plan: CBC with Differential/Platelets, Testosterone  Paresthesia of both hands  Insomnia  High risk medication use - Plan: CBC with Differential/Platelets  Other fatigue - Plan: Testosterone, TSH  Depression - Plan: Testosterone, TSH  Failure of erection    1.    Continue Tecfidera.   We will check a CBC with differential today.   Consider an MRI of the time of the next visit to determine if there is been any subclinical progression. If present, we would consider a switch to a more efficacious medication. 2,     If the hand tingling worsens, consider MRI of the cervical spine to determine there has been progression of the MS or of the degenerative changes.  3.  He will return to see me in 6 months if stable but call sooner if he has any significant new or worsening neurologic symptoms   Perry Reinard A. Felecia Shelling, MD, PhD [id number], [id number] AM Certified in Neurology, Clinical Neurophysiology, Sleep Medicine, Pain Medicine and Neuroimaging  [facility] [address] [place], [place] [address] [phone]

## 2016-01-06 LAB — CBC WITH DIFFERENTIAL/PLATELET
BASOS ABS: 0 10*3/uL (ref 0.0–0.2)
Basos: 0 %
EOS (ABSOLUTE): 0.1 10*3/uL (ref 0.0–0.4)
Eos: 2 %
Hematocrit: 44.8 % (ref 37.5–51.0)
Hemoglobin: 15.2 g/dL (ref 12.6–17.7)
Immature Grans (Abs): 0 10*3/uL (ref 0.0–0.1)
Immature Granulocytes: 0 %
LYMPHS ABS: 1.9 10*3/uL (ref 0.7–3.1)
LYMPHS: 27 %
MCH: 29.3 pg (ref 26.6–33.0)
MCHC: 33.9 g/dL (ref 31.5–35.7)
MCV: 86 fL (ref 79–97)
Monocytes Absolute: 0.5 10*3/uL (ref 0.1–0.9)
Monocytes: 7 %
NEUTROS ABS: 4.6 10*3/uL (ref 1.4–7.0)
Neutrophils: 64 %
PLATELETS: 154 10*3/uL (ref 150–379)
RBC: 5.19 x10E6/uL (ref 4.14–5.80)
RDW: 13.6 % (ref 12.3–15.4)
WBC: 7.3 10*3/uL (ref 3.4–10.8)

## 2016-01-06 LAB — TESTOSTERONE: TESTOSTERONE: 541 ng/dL (ref 348–1197)

## 2016-01-06 LAB — TSH: TSH: 1.95 u[IU]/mL (ref 0.450–4.500)

## 2016-01-09 NOTE — Telephone Encounter (Signed)
I have spoken with Lavon this afternoon, and per RAS, advised that labwork done in our office looks normal.  He verbalized understanding of same/fim

## 2016-01-09 NOTE — Telephone Encounter (Signed)
-----   Message from Britt Bottom, MD sent at 01/06/2016 11:29 AM EDT ----- Please let him know that all of the laboratory tests were normal.

## 2016-01-23 ENCOUNTER — Telehealth: Payer: Self-pay | Admitting: Neurology

## 2016-01-23 MED ORDER — BUPROPION HCL ER (XL) 150 MG PO TB24: 150.0000 mg | ORAL_TABLET | Freq: Every day | ORAL | Status: DC

## 2016-01-23 NOTE — Telephone Encounter (Signed)
Pt called said he is experiencing presure in the head, ears ringing, insomnia, "thick headed" since starting buPROPion (WELLBUTRIN XL) 300 MG 24 hr tablet . The symptoms started 01/21/16, he has been taking it for about 1 week. He thinks the 300mg  is "too much" and would like the dose lowered. Pt sts he will be tied up with college today but a message can left on VM

## 2016-01-23 NOTE — Telephone Encounter (Signed)
LMOM that RAS would like to decrease Welbutrin XL to 150mg  po qd.  New rx. escribed to [facility]/fim

## 2016-01-27 ENCOUNTER — Ambulatory Visit: Payer: Managed Care, Other (non HMO) | Admitting: Neurology

## 2016-07-06 ENCOUNTER — Encounter: Payer: Self-pay | Admitting: Neurology

## 2016-07-06 ENCOUNTER — Ambulatory Visit (INDEPENDENT_AMBULATORY_CARE_PROVIDER_SITE_OTHER): Payer: BLUE CROSS/BLUE SHIELD | Admitting: Neurology

## 2016-07-06 VITALS — BP 118/86 | HR 76 | Resp 16 | Ht 71.0 in | Wt 209.5 lb

## 2016-07-06 DIAGNOSIS — Z79899 Other long term (current) drug therapy: Secondary | ICD-10-CM

## 2016-07-06 DIAGNOSIS — R5383 Other fatigue: Secondary | ICD-10-CM

## 2016-07-06 DIAGNOSIS — F329 Major depressive disorder, single episode, unspecified: Secondary | ICD-10-CM | POA: Diagnosis not present

## 2016-07-06 DIAGNOSIS — R200 Anesthesia of skin: Secondary | ICD-10-CM | POA: Diagnosis not present

## 2016-07-06 DIAGNOSIS — G35 Multiple sclerosis: Secondary | ICD-10-CM

## 2016-07-06 DIAGNOSIS — G4700 Insomnia, unspecified: Secondary | ICD-10-CM

## 2016-07-06 DIAGNOSIS — G35D Multiple sclerosis, unspecified: Secondary | ICD-10-CM

## 2016-07-06 DIAGNOSIS — F32A Depression, unspecified: Secondary | ICD-10-CM

## 2016-07-06 NOTE — Progress Notes (Addendum)
[facility]  PATIENT: Perry Gentry DOB: 11-Apr-1966  REFERRING DOCTOR OR PCP:  [place] Pink SOURCE: patient and records  _________________________________   HISTORICAL  CHIEF COMPLAINT:  Chief Complaint  Patient presents with  . Multiple Sclerosis    Sts. he continues to tolerate Tecfidera well.  Doesn't think numbness/tingling in his hands is any worse than at last ov./fim    HISTORY OF PRESENT ILLNESS:  Perry Gentry is a right handed 50 year old man with MS diagnosed in 2014.    MS:   He is on Tecfidera and he tolerates Tecfidera well and denies significant flushing or GI upset.    He has some flushing if he takes on an empty stomach.  He denies any definite exacerbation but has noted more numbness in the hands and palms.   Hand Numbness:   He reports bilateral hand numbness, mostly in the middle three fingertips..    He does not have Phalen's or Tinel's sings but has awoken a few times with numbness in his hands, once severe enough that he could not move his wrist x several minutes.   MRI of the cervical spine 2015 [place] shows a spinal cord plaque at C3-C4 that is central and slightly to the left.  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 Every once and awhile when he turns fast he feels slightly off balanced.   He lifts weights once or twice a week.   He does not note any imbalance with his eyes closed. He has noted numbness that was only in the fingertips last year is now  Involving the hands.   His hands are also sometimes stiff.    Tingling does not change depending on position.   No grip weakness.  He denies any weakness or numbness in his hands or legs.   Bladder/bowel: Bowel function is fine.   Prostate is enlarged and he sometimes has urinary urgency.   Ho hesitancy .  He has noted decreased  libido and ED.    PSA has been fine  Vision:    Since his mid 18s, he has had decreased color saturation and a Vf defect on the left.   Right eye is fine  Fatigue/sleep: He feels energy is good most days.    He feels that he sleeps well most nights but not every night. Some nights he falls asleep but then wakes up and has trouble going back to sleep.   He is not always refreshed in the morning.    He snores some and he reports his wife has never commented to him about pauses or gasping at night.    He occasionally takes a 10 mg doxepin.     Mood/cognition: He feels mood is doing a little better.,    Buproprion had no effect so he stopped.    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   MS History:    In his 76s, he had visual changes in the left eye where he felt that was a veil over his vision.   Colors are still desaturated out of the left eye.   In 2010 he noted that when he would bend his neck forward he would have a zinging sensation going down from the spine to both of his hands. Over a couple years this  improved.  He was noted numbness in his hands since the middle of 2014. Sometimes, he would drop items.  There was not any pain nor any weakness.  [name] He saw orthopedics for his hand numbness in 2014 and had an MRI of the cervical spine. The MRI showed plaque at the C2-C3 region consistent with multiple sclerosis. It also showed degenerative changes at C6-C7 with severe bilateral foraminal stenosis. A subsequent MRI of the brain showed several white matter lesions (2 periventricular, 1-2 cortical foci)  consistent with multiple sclerosis. No abnormal enhancement.   He was referred to Dr. Manuella Ghazi in [place] who diagnosed him with multiple sclerosis.   He was started on Tecfidera.     REVIEW OF SYSTEMS: Constitutional: No fevers, chills, sweats, or change in .   He notes some fatigue Eyes: No visual changes, double vision, eye pain Ear, nose and throat: No  hearing loss, ear pain, nasal congestion, sore throat Cardiovascular: No chest pain, palpitations Respiratory: No shortness of breath at rest or with exertion.   No wheezes GastrointestinaI: No nausea, vomiting, diarrhea, abdominal pain, fecal incontinence Genitourinary: No dysuria, urinary retention or frequency.  No nocturia.   He has ED and decreased libido Musculoskeletal: No neck pain, back pain Integumentary: No rash, pruritus, skin lesions Neurological: as above Psychiatric: as above Endocrine: No palpitations, diaphoresis, change in appetite, change in weigh or increased thirst Hematologic/Lymphatic: No anemia, purpura, petechiae. Allergic/Immunologic: No itchy/runny eyes, nasal congestion, recent allergic reactions, rashes  ALLERGIES: No Known Allergies  HOME MEDICATIONS:  Current Outpatient Prescriptions:  .  cholecalciferol (VITAMIN D) 1000 UNITS tablet, Take 1,000 Units by mouth daily., Disp: , Rfl:  .  doxepin (SINEQUAN) 10 MG capsule, Take 1 capsule (10 mg total) by mouth at bedtime., Disp: 90 capsule, Rfl: 3 .  TECFIDERA 240 MG CPDR, Take 1 capsule (240 mg total) by mouth 2 (two) times daily., Disp: 180 capsule, Rfl: 3 .  buPROPion (WELLBUTRIN XL) 150 MG 24 hr tablet, Take 1 tablet (150 mg total) by mouth daily. (Patient not taking: Reported on 07/06/2016), Disp: 30 tablet, Rfl: 5  PAST MEDICAL HISTORY: Past Medical History:  Diagnosis Date  . Kidney stones   . Multiple sclerosis ([place])   . Vision abnormalities     PAST SURGICAL HISTORY: Past Surgical History:  Procedure Laterality Date  . CYSTOSCOPY WITH STENT PLACEMENT    . LITHOTRIPSY      FAMILY HISTORY: Family History  Problem Relation Age of Onset  . Hypertension Mother   . Hypertension Father     SOCIAL HISTORY:  Social History   Social History  . Marital status: Married    Spouse name: N/A  . Number of children: N/A  . Years of education: N/A   Occupational History  . Not on file.    Social History Main Topics  . Smoking status: Never Smoker  . Smokeless tobacco: Not on file  . Alcohol use Yes  . Drug use: Unknown  . Sexual activity: Not on file   Other Topics Concern  . Not on file   Social History Narrative  . No narrative on file     PHYSICAL EXAM  Vitals:   07/06/16 0842  BP: 118/86  Pulse: 76  Resp: 16  Weight: 209 lb 8 oz (95 kg)  Height: 5\' 11"  (1.803 m)    Body mass index is 29.22 kg/m.   General: The patient is well-developed and well-nourished and in no acute distress   Neck: The neck  is supple .  The neck is nontender.    Neurologic Exam  Mental status: The patient is alert and oriented x 3 at the time of the examination. The patient has apparent normal recent and remote memory, with an apparently normal attention span and concentration ability.   Speech is normal.  Cranial nerves: Extraocular movements are full. Pupils are equal, round, and reactive to light and accomodation.  Visual fields are full.  Color saturation is reduced out of the left eye. Facial symmetry is present. There is good facial sensation to soft touch bilaterally.Facial strength is normal.  Trapezius and sternocleidomastoid strength is normal. No dysarthria is noted.  The tongue is midline, and the patient has symmetric elevation of the soft palate. No obvious hearing deficits are noted.  Motor:  Muscle bulk is normal.   Tone is normal. Strength is  5 / 5 in all 4 extremities.   Sensory: Sensory testing is intact to touch sensation in all 4 extremities.  Coordination: Cerebellar testing reveals good finger-nose-finger and heel-to-shin bilaterally.  Gait and station: Station is normal.   Gait is normal. Tandem gait is normal. Romberg is negative.   Reflexes: Deep tendon reflexes are symmetric and normal bilaterally.    Other:    There were no Phalen's or Tinel's signs at the wrists    DIAGNOSTIC DATA (LABS, IMAGING, TESTING) - I reviewed patient records,  labs, notes, testing and imaging myself where available.    ASSESSMENT AND PLAN  Multiple sclerosis ([place]) - Plan: CBC with Differential/Platelet, Comprehensive metabolic panel, MR BRAIN W WO CONTRAST  High risk medication use - Plan: CBC with Differential/Platelet, Comprehensive metabolic panel  Hand numbness - Plan: MR BRAIN W WO CONTRAST  Other fatigue  Insomnia, unspecified type  Depression, unspecified depression type   1.    Continue Tecfidera.   Check a CBC with differential and LFT today.    2.    Brain MRI to determine if there is been any subclinical progression. If present, we would consider a switch to a more efficacious medication. 3.      If the hand tingling worsens, consider NCV/EMG and MRI of the cervical spine to determine there has been progression of the MS or of the degenerative changes.  3.   Mood is doing better and he is off Wellbutrin but if worsens again, consider Cymbalta and/or referral. 4.   He will return to see me in 6 months if stable but call sooner if he has any significant new or worsening neurologic symptoms   Sofia Vanmeter A. Felecia Shelling, MD, PhD [id number], [id number] AM Certified in Neurology, Clinical Neurophysiology, Sleep Medicine, Pain Medicine and Neuroimaging  [facility] [address], [place], [place] [address] [phone]

## 2016-07-07 LAB — CBC WITH DIFFERENTIAL/PLATELET
Basophils Absolute: 0 10*3/uL (ref 0.0–0.2)
Basos: 1 %
EOS (ABSOLUTE): 0.1 10*3/uL (ref 0.0–0.4)
EOS: 2 %
HEMATOCRIT: 42.2 % (ref 37.5–51.0)
Hemoglobin: 14.1 g/dL (ref 13.0–17.7)
IMMATURE GRANULOCYTES: 0 %
Immature Grans (Abs): 0 10*3/uL (ref 0.0–0.1)
LYMPHS ABS: 2.3 10*3/uL (ref 0.7–3.1)
Lymphs: 33 %
MCH: 28.7 pg (ref 26.6–33.0)
MCHC: 33.4 g/dL (ref 31.5–35.7)
MCV: 86 fL (ref 79–97)
MONOS ABS: 0.4 10*3/uL (ref 0.1–0.9)
Monocytes: 6 %
NEUTROS PCT: 58 %
Neutrophils Absolute: 4 10*3/uL (ref 1.4–7.0)
PLATELETS: 168 10*3/uL (ref 150–379)
RBC: 4.92 x10E6/uL (ref 4.14–5.80)
RDW: 13.8 % (ref 12.3–15.4)
WBC: 6.8 10*3/uL (ref 3.4–10.8)

## 2016-07-07 LAB — COMPREHENSIVE METABOLIC PANEL
ALK PHOS: 64 IU/L (ref 39–117)
ALT: 30 IU/L (ref 0–44)
AST: 18 IU/L (ref 0–40)
Albumin/Globulin Ratio: 1.9 (ref 1.2–2.2)
Albumin: 4.5 g/dL (ref 3.5–5.5)
BUN/Creatinine Ratio: 15 (ref 9–20)
BUN: 16 mg/dL (ref 6–24)
Bilirubin Total: 0.3 mg/dL (ref 0.0–1.2)
CALCIUM: 9.7 mg/dL (ref 8.7–10.2)
CO2: 27 mmol/L (ref 18–29)
Chloride: 102 mmol/L (ref 96–106)
Creatinine, Ser: 1.07 mg/dL (ref 0.76–1.27)
GFR calc Af Amer: 92 mL/min/{1.73_m2} (ref >59)
GFR calc non Af Amer: 80 mL/min/{1.73_m2} (ref >59)
GLOBULIN, TOTAL: 2.4 g/dL (ref 1.5–4.5)
Glucose: 103 mg/dL — ABNORMAL HIGH (ref 65–99)
POTASSIUM: 5 mmol/L (ref 3.5–5.2)
SODIUM: 141 mmol/L (ref 134–144)
Total Protein: 6.9 g/dL (ref 6.0–8.5)

## 2016-07-09 ENCOUNTER — Telehealth: Payer: Self-pay | Admitting: *Deleted

## 2016-07-09 NOTE — Telephone Encounter (Signed)
I have spoken with Perry Gentry this afternoon and per RAS, advised that labs done in our office are fine.  He verbalized understanding of same/fim

## 2016-07-09 NOTE — Telephone Encounter (Signed)
-----   Message from Britt Bottom, MD sent at 07/07/2016  1:52 PM EST ----- Please let him know the labwork is fine

## 2016-08-05 ENCOUNTER — Ambulatory Visit
Admission: RE | Admit: 2016-08-05 | Discharge: 2016-08-05 | Disposition: A | Discharge status: Home / Self Care | Payer: Managed Care, Other (non HMO) | Source: Ambulatory Visit | Attending: Neurology | Admitting: Neurology

## 2016-08-05 DIAGNOSIS — R200 Anesthesia of skin: Secondary | ICD-10-CM

## 2016-08-05 DIAGNOSIS — G35 Multiple sclerosis: Secondary | ICD-10-CM

## 2016-08-05 MED ORDER — GADOBENATE DIMEGLUMINE 529 MG/ML IV SOLN
20.0000 mL | Freq: Once | INTRAVENOUS | Status: AC | PRN
  Administered 2016-08-05: 20 mL via INTRAVENOUS

## 2016-08-06 ENCOUNTER — Telehealth: Payer: Self-pay | Admitting: *Deleted

## 2016-08-06 NOTE — Telephone Encounter (Signed)
-----   Message from Britt Bottom, MD sent at 08/06/2016 12:35 PM EST ----- Present him know that the MRI of the brain does not show any new MS lesions.

## 2016-08-06 NOTE — Telephone Encounter (Signed)
I have spoken with Perry Gentry this afternoon, and per RAS, explained that MRI brain did not show any new MS changes/fim

## 2016-09-26 ENCOUNTER — Other Ambulatory Visit: Payer: Self-pay | Admitting: Neurology

## 2017-01-04 ENCOUNTER — Ambulatory Visit: Payer: BLUE CROSS/BLUE SHIELD | Admitting: Neurology

## 2017-08-21 ENCOUNTER — Telehealth: Payer: Self-pay | Admitting: Neurology

## 2017-08-21 NOTE — Telephone Encounter (Signed)
Pt called he needs refill for TECFIDERA 240 MG CPDR., he will be out next week.  Pt could come anytime this week or 09/22/17. He will be going out of town and needs medication prior. An appt has been scheduled for 3/26, can he be seen sooner. Pt was not aware he was last seen in 2017.

## 2017-08-22 ENCOUNTER — Other Ambulatory Visit: Payer: Self-pay | Admitting: *Deleted

## 2017-08-22 ENCOUNTER — Other Ambulatory Visit (INDEPENDENT_AMBULATORY_CARE_PROVIDER_SITE_OTHER): Payer: Self-pay

## 2017-08-22 DIAGNOSIS — G35 Multiple sclerosis: Secondary | ICD-10-CM

## 2017-08-22 DIAGNOSIS — Z79899 Other long term (current) drug therapy: Secondary | ICD-10-CM

## 2017-08-22 DIAGNOSIS — Z0289 Encounter for other administrative examinations: Secondary | ICD-10-CM

## 2017-08-22 MED ORDER — TECFIDERA 240 MG PO CPDR: 1.0000 | DELAYED_RELEASE_CAPSULE | Freq: Two times a day (BID) | ORAL | 0 refills | Status: DC

## 2017-08-22 NOTE — Telephone Encounter (Signed)
Spoke with pt.  He made an appt. with RAS. Unfortunately, RAS is ooo this afternoon and tomorrow.  Pt. will come in today for CBC to check lymphocyte count, and r/f to last to appt. has been sent to [facility]/fim

## 2017-08-23 LAB — CBC WITH DIFFERENTIAL/PLATELET
Basophils Absolute: 0 10*3/uL (ref 0.0–0.2)
Basos: 0 %
EOS (ABSOLUTE): 0.1 10*3/uL (ref 0.0–0.4)
EOS: 1 %
HEMATOCRIT: 43.2 % (ref 37.5–51.0)
HEMOGLOBIN: 14.5 g/dL (ref 13.0–17.7)
Immature Grans (Abs): 0 10*3/uL (ref 0.0–0.1)
Immature Granulocytes: 0 %
LYMPHS ABS: 1.8 10*3/uL (ref 0.7–3.1)
Lymphs: 24 %
MCH: 28.6 pg (ref 26.6–33.0)
MCHC: 33.6 g/dL (ref 31.5–35.7)
MCV: 85 fL (ref 79–97)
MONOCYTES: 6 %
Monocytes Absolute: 0.5 10*3/uL (ref 0.1–0.9)
NEUTROS ABS: 5 10*3/uL (ref 1.4–7.0)
Neutrophils: 69 %
Platelets: 176 10*3/uL (ref 150–379)
RBC: 5.07 x10E6/uL (ref 4.14–5.80)
RDW: 13.2 % (ref 12.3–15.4)
WBC: 7.4 10*3/uL (ref 3.4–10.8)

## 2017-08-26 ENCOUNTER — Telehealth: Payer: Self-pay

## 2017-08-26 MED ORDER — TECFIDERA 240 MG PO CPDR: 1.0000 | DELAYED_RELEASE_CAPSULE | Freq: Two times a day (BID) | ORAL | 3 refills | Status: DC

## 2017-08-26 NOTE — Telephone Encounter (Signed)
I called patient and made him aware of his lab results. He mentioned that he has to use a new pharmacy, [facility], and will need a new Rx sent to them. I have sent this to them and advised him to contact us if there are any problems.

## 2017-08-26 NOTE — Telephone Encounter (Signed)
-----   Message from Britt Bottom, MD sent at 08/23/2017 11:41 PM EST ----- Please let the patient know that the lab work is fine.

## 2017-09-23 ENCOUNTER — Other Ambulatory Visit: Payer: Self-pay | Admitting: Neurology

## 2017-10-10 ENCOUNTER — Other Ambulatory Visit: Payer: Self-pay | Admitting: Neurology

## 2017-10-11 ENCOUNTER — Other Ambulatory Visit: Payer: Self-pay | Admitting: *Deleted

## 2017-10-11 MED ORDER — TECFIDERA 240 MG PO CPDR: 1.0000 | DELAYED_RELEASE_CAPSULE | Freq: Two times a day (BID) | ORAL | 3 refills | Status: DC

## 2017-10-22 ENCOUNTER — Ambulatory Visit: Payer: BLUE CROSS/BLUE SHIELD | Admitting: Neurology

## 2017-11-13 ENCOUNTER — Encounter: Payer: Self-pay | Admitting: Neurology

## 2017-11-13 ENCOUNTER — Telehealth: Payer: Self-pay | Admitting: Neurology

## 2017-11-13 ENCOUNTER — Other Ambulatory Visit: Payer: Self-pay

## 2017-11-13 ENCOUNTER — Ambulatory Visit: Payer: Managed Care, Other (non HMO) | Admitting: Neurology

## 2017-11-13 DIAGNOSIS — G35 Multiple sclerosis: Secondary | ICD-10-CM

## 2017-11-13 NOTE — Progress Notes (Signed)
[facility]  PATIENT: Perry Gentry DOB: 10-22-1965  REFERRING DOCTOR OR PCP:  [place] Pink SOURCE: patient and records  _________________________________   HISTORICAL  CHIEF COMPLAINT:  Chief Complaint  Patient presents with  . Multiple Sclerosis    Last seen 07/06/16. Sts. he continues to tolerate Tecfidera well.  Sts. numbness/tingling in bilat hands is worse.    HISTORY OF PRESENT ILLNESS:  [name] is a right handed 52 year old man with MS diagnosed in 2014.    Update 11/13/2017: He feels his MS has been mostly stable though he is noticed some more numbness in his hands.  He has been on Tecfidera and tolerates it well.  He is not having any problems with flushing or GI upset.   The numbness and tingling has been present since the onset of symptoms but has slowly progressed toinclude the entire hand bilaterally.       I personally reviewed the MRI of the brain dated 08/05/2016.  It shows a few  T2/FLAIR hyperintense foci in the   white matter, most nonspecific.  [name]  An MRI of the cervical spine in 2015 did show that he had a spinal cord plaque at C3-C4 more to the left and also has degenerative changes to the right C3-C4 and bilaterally at C6-C7.  In his 20's he had optic neuritis (left) and then the hand numbness in 2015.    From 07/06/2016: MS:   He is on Tecfidera and he tolerates Tecfidera well and denies significant flushing or GI upset.    He has some flushing if he takes on an empty stomach.  He denies any definite exacerbation but has noted more numbness in the hands and palms.   Hand Numbness:   He reports bilateral hand numbness, mostly in the middle three fingertips..    He does not have Phalen's or Tinel's sings but has awoken a few times with numbness in his hands, once severe enough that he could not move his wrist x several minutes.   MRI of the cervical spine 2015 [place] shows a spinal cord plaque at C3-C4 that is central and  slightly to the left.  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 Every once and awhile when he turns fast he feels slightly off balanced.   He lifts weights once or twice a week.   He does not note any imbalance with his eyes closed. He has noted numbness that was only in the fingertips last year is now  Involving the hands.   His hands are also sometimes stiff.    Tingling does not change depending on position.   No grip weakness.  He denies any weakness or numbness in his hands or legs.   Bladder/bowel: Bowel function is fine.   Prostate is enlarged and he sometimes has urinary urgency.   Ho hesitancy .  He has noted decreased libido and ED.    PSA has been fine  Vision:    Since his mid 31s, he has had decreased color saturation and a Vf defect on the left.   Right eye is fine  Fatigue/sleep: He feels energy is good most days.    He feels that he sleeps well most nights but not every night. Some nights he falls asleep but then wakes up and has trouble going back to sleep.  He is not always refreshed in the morning.    He snores some and he reports his wife has never commented to him about pauses or gasping at night.    He occasionally takes a 10 mg doxepin.     Mood/cognition: He feels mood is doing a little better.,    Buproprion had no effect so he stopped.    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   MS History:    In his 87s, he had visual changes in the left eye where he felt that was a veil over his vision.   Colors are still desaturated out of the left eye.   In 2010 he noted that when he would bend his neck forward he would have a zinging sensation going down from the spine to both of his hands. Over a couple years this improved.  He was noted numbness in his hands since the middle of 2014. Sometimes, he  would drop items.  There was not any pain nor any weakness.  [name] He saw orthopedics for his hand numbness in 2014 and had an MRI of the cervical spine. The MRI showed plaque at the C2-C3 region consistent with multiple sclerosis. It also showed degenerative changes at C6-C7 with severe bilateral foraminal stenosis. A subsequent MRI of the brain showed several white matter lesions (2 periventricular, 1-2 cortical foci)  consistent with multiple sclerosis. No abnormal enhancement.   He was referred to Dr. Manuella Ghazi in [place] who diagnosed him with multiple sclerosis.   He was started on Tecfidera.     REVIEW OF SYSTEMS: Constitutional: No fevers, chills, sweats, or change in .   He notes some fatigue Eyes: No visual changes, double vision, eye pain Ear, nose and throat: No hearing loss, ear pain, nasal congestion, sore throat Cardiovascular: No chest pain, palpitations Respiratory: No shortness of breath at rest or with exertion.   No wheezes GastrointestinaI: No nausea, vomiting, diarrhea, abdominal pain, fecal incontinence Genitourinary: No dysuria, urinary retention or frequency.  No nocturia.   He has ED and decreased libido Musculoskeletal: No neck pain, back pain Integumentary: No rash, pruritus, skin lesions Neurological: as above Psychiatric: as above Endocrine: No palpitations, diaphoresis, change in appetite, change in weigh or increased thirst Hematologic/Lymphatic: No anemia, purpura, petechiae. Allergic/Immunologic: No itchy/runny eyes, nasal congestion, recent allergic reactions, rashes  ALLERGIES: No Known Allergies  HOME MEDICATIONS:  Current Outpatient Medications:  .  cholecalciferol (VITAMIN D) 1000 UNITS tablet, Take 1,000 Units by mouth daily., Disp: , Rfl:  .  TECFIDERA 240 MG CPDR, Take 1 capsule (240 mg total) by mouth 2 (two) times daily., Disp: 180 capsule, Rfl: 3  PAST MEDICAL HISTORY: Past Medical History:  Diagnosis Date  . Kidney stones   . Multiple  sclerosis ([place])   . Vision abnormalities     PAST SURGICAL HISTORY: Past Surgical History:  Procedure Laterality Date  . CYSTOSCOPY WITH STENT PLACEMENT    . LITHOTRIPSY      FAMILY HISTORY: Family History  Problem Relation Age of Onset  . Hypertension Mother   . Hypertension Father     SOCIAL HISTORY:  Social History   Socioeconomic History  . Marital status: Married    Spouse name: Not on file  . Number of children: Not on file  . Years of education: Not on file  . Highest education level: Not on file  Occupational History  . Not on file  Social Needs  .  Financial resource strain: Not on file  . Food insecurity:    Worry: Not on file    Inability: Not on file  . Transportation needs:    Medical: Not on file    Non-medical: Not on file  Tobacco Use  . Smoking status: Never Smoker  . Smokeless tobacco: Never Used  Substance and Sexual Activity  . Alcohol use: Yes  . Drug use: Not on file  . Sexual activity: Not on file  Lifestyle  . Physical activity:    Days per week: Not on file    Minutes per session: Not on file  . Stress: Not on file  Relationships  . Social connections:    Talks on phone: Not on file    Gets together: Not on file    Attends religious service: Not on file    Active member of club or organization: Not on file    Attends meetings of clubs or organizations: Not on file    Relationship status: Not on file  . Intimate partner violence:    Fear of current or ex partner: Not on file    Emotionally abused: Not on file    Physically abused: Not on file    Forced sexual activity: Not on file  Other Topics Concern  . Not on file  Social History Narrative  . Not on file     PHYSICAL EXAM  Vitals:   11/13/17 1458  BP: 132/79  Pulse: 78  Resp: 16  Weight: 211 lb 8 oz (95.9 kg)  Height: 5\' 11"  (1.803 m)    Body mass index is 29.5 kg/m.   General: The patient is well-developed and well-nourished and in no acute distress     Neck: The neck is supple .  The neck is nontender.    Neurologic Exam  Mental status: The patient is alert and oriented x 3 at the time of the examination. The patient has apparent normal recent and remote memory, with an apparently normal attention span and concentration ability.   Speech is normal.  Cranial nerves: Extraocular movements are full. Pupils are equal, round, and reactive to light and accomodation.  Visual fields are full.  Color saturation is reduced out of the left eye. Facial symmetry is present. There is good facial sensation to soft touch bilaterally.Facial strength is normal.  Trapezius and sternocleidomastoid strength is normal. No dysarthria is noted.  The tongue is midline, and the patient has symmetric elevation of the soft palate. No obvious hearing deficits are noted.  Motor:  Muscle bulk is normal.   Tone is normal. Strength is  5 / 5 in all 4 extremities.   Sensory: Sensory testing is intact to touch and vibration in the arms and legs.  Coordination: Cerebellar testing reveals good finger-nose-finger and heel-to-shin bilaterally.  Gait and station: Station is normal.   Gait is normal.  Tandem gait is normal.  Romberg is negative.  .   Reflexes: Deep tendon reflexes are symmetric and normal bilaterally.    Other:   He did not have Tinel's or Phalen's signs.    DIAGNOSTIC DATA (LABS, IMAGING, TESTING) - I reviewed patient records, labs, notes, testing and imaging myself where available.    ASSESSMENT AND PLAN  Multiple sclerosis ([place]) - Plan: MR CERVICAL SPINE WO CONTRAST, MR BRAIN W WO CONTRAST   1.    Continue Tecfidera.     Blood work was checked a couple months ago. 2.    Brain and Cervical spine  MRI to determine if there is been any subclinical progression or new degenerative change that explains his worsening numbness. If new foci present, we would consider a switch to a more efficacious medication. 3.     If the hand tingling worsens, consider  NCV/EMG to r/o CTS, ulnar neuropathy, radiculopathy  4.   He will return to see me in 9 months if stable but call sooner if he has any significant new or worsening neurologic symptoms   Richard A. Felecia Shelling, MD, PhD 1/66/0630, 1:60 PM Certified in Neurology, Clinical Neurophysiology, Sleep Medicine, Pain Medicine and Neuroimaging  [facility] [address], [place], [place] [address] [phone]

## 2017-11-13 NOTE — Telephone Encounter (Signed)
[employer] order sent to GI. They will obtain the auth and reach out to the patient to schedule.  

## 2017-11-26 ENCOUNTER — Ambulatory Visit
Admission: RE | Admit: 2017-11-26 | Discharge: 2017-11-26 | Disposition: A | Discharge status: Home / Self Care | Payer: Managed Care, Other (non HMO) | Source: Ambulatory Visit | Attending: Neurology | Admitting: Neurology

## 2017-11-26 ENCOUNTER — Other Ambulatory Visit: Payer: Managed Care, Other (non HMO)

## 2017-11-26 DIAGNOSIS — G35 Multiple sclerosis: Secondary | ICD-10-CM

## 2017-11-26 MED ORDER — GADOBENATE DIMEGLUMINE 529 MG/ML IV SOLN
20.0000 mL | Freq: Once | INTRAVENOUS | Status: AC | PRN
  Administered 2017-11-26: 20 mL via INTRAVENOUS

## 2017-11-26 NOTE — Telephone Encounter (Signed)
[employer] approved the MRI Brain w/wo contrast but not the MRI Cervical spine wo contrast. They will approved it if the order is switch to either a MRI Cervical spine w/wo contrast or a MRI Thoracic spine w/wo contrast.. Right now he is schedule this afternoon at [place].

## 2017-11-26 NOTE — Telephone Encounter (Signed)
Noted, thank you I called [employer] and got it switched and it has been approved.  Novella Rob: [id number] (exp. 11/21/17 to 02/19/18)

## 2017-11-26 NOTE — Addendum Note (Signed)
Addended by: France Ravens I on: 11/26/2017 10:11 AM   Modules accepted: Orders

## 2017-11-28 ENCOUNTER — Telehealth: Payer: Self-pay | Admitting: *Deleted

## 2017-11-28 DIAGNOSIS — R200 Anesthesia of skin: Secondary | ICD-10-CM

## 2017-11-28 DIAGNOSIS — G35 Multiple sclerosis: Secondary | ICD-10-CM

## 2017-11-28 NOTE — Telephone Encounter (Signed)
-----   Message from Britt Bottom, MD sent at 11/27/2017  7:43 PM EDT ----- Please let him know that the MRI of the brain and cervical spine do not show any new MS lesions.   He does have some disc protrusion and bone spurs that could cause mild nerve root compression to the right at C4 to the left at C7.

## 2017-11-28 NOTE — Telephone Encounter (Signed)
Spoke with pt. and reviewed below MRI report. He verbalized understanding of same, sts. would like to further investigate the cause of bilat. hand numbness.  Per RAS, ok for bilat ncv/emg as discussed at last ov.  Order placed in Epic/fim

## 2017-12-17 ENCOUNTER — Encounter: Payer: Managed Care, Other (non HMO) | Admitting: Neurology

## 2017-12-31 ENCOUNTER — Ambulatory Visit (INDEPENDENT_AMBULATORY_CARE_PROVIDER_SITE_OTHER): Payer: Managed Care, Other (non HMO) | Admitting: Neurology

## 2017-12-31 ENCOUNTER — Ambulatory Visit: Payer: Managed Care, Other (non HMO) | Admitting: Neurology

## 2017-12-31 DIAGNOSIS — G35 Multiple sclerosis: Secondary | ICD-10-CM

## 2017-12-31 DIAGNOSIS — R200 Anesthesia of skin: Secondary | ICD-10-CM

## 2017-12-31 DIAGNOSIS — Z0289 Encounter for other administrative examinations: Secondary | ICD-10-CM

## 2017-12-31 NOTE — Progress Notes (Signed)
Full Name: Perry Gentry Gender: Male MRN #: [id number] Date of Birth: 1966-07-06    Visit Date: 12/31/2017 09:21 Age: 52 Years 42 Months Old Examining Physician: Arlice Colt, MD  Referring Physician: Felecia Shelling, MD    History:   Perry Gentry is a 52 year old man with multiple sclerosis who has numbness in both hands, left greater than right.  Additionally, he has very mild weakness in the triceps on the left.  Nerve conduction studies: Bilateral median and ulnar motor responses had normal distal latencies, amplitudes and conduction velocities.  Bilateral median orthodromic median and ulnar sensory responses had normal peak latencies and amplitudes.  However, bilateral median antidromic sensory responses across the wrist had borderline prolonged peak latencies with normal amplitudes.  Electromyography: Needle EMG of selected muscles of the left arm was performed.  There was mild chronic denervation in the C7 innervated triceps muscle.  Two other C7 innervated muscles had a few polyphasic motor units with normal recruitment.  There was no abnormal spontaneous activity in these muscles.  Other muscles tested  were normal.   IMPRESSION This NCV/EMG study shows the following: 1.    Mild left chronic C7 radiculopathy without active features 2.    Borderline carpal tunnel syndromes  Richard A. Felecia Shelling, MD, PhD, FAAN Certified in Neurology, Clinical Neurophysiology, Sleep Medicine, Pain Medicine and Neuroimaging Director, [place] [facility] [address] [place], [place] [address] [phone]         [facility]    Nerve / Sites Muscle Latency Ref. Amplitude Ref. Rel Amp Segments Distance Velocity Ref. Area    ms ms mV mV %  cm m/s m/s mVms  R Median - APB     Wrist APB 3.4 ?4.4 4.4 ?4.0 100 Wrist - APB 7   15.7     Upper arm APB 7.9  4.5  102 Upper arm - Wrist 23 51 ?49 16.2  L Median - APB   Wrist APB 3.3 ?4.4 7.6 ?4.0 100 Wrist - APB 7   29.5     Upper arm APB 7.8  7.0  92.5 Upper arm - Wrist 23 51 ?49 26.5  R Ulnar - ADM     Wrist ADM 2.7 ?3.3 9.9 ?6.0 100 Wrist - ADM 7   38.3     B.Elbow ADM 6.8  8.7  87.1 B.Elbow - Wrist 22 53 ?49 36.0     A.Elbow ADM 9.2  8.6  99.2 A.Elbow - B.Elbow 12 51 ?49 35.5         A.Elbow - Wrist      L Ulnar - ADM     Wrist ADM 2.6 ?3.3 10.5 ?6.0 100 Wrist - ADM 7   34.8     B.Elbow ADM 6.6  9.9  94.4 B.Elbow - Wrist 22 55 ?49 33.7     A.Elbow ADM 8.5  9.5  96.2 A.Elbow - B.Elbow 10 52 ?49 34.2         A.Elbow - Wrist                 SNC    Nerve / Sites Rec. Site Peak Lat Ref.  Amp Ref. Segments Distance Peak Diff Ref.    ms ms V V  cm ms ms  R Median, Ulnar - Transcarpal comparison     Median Palm Wrist 2.3 ?2.2 36 ?35 Median Palm - Wrist 8  Ulnar Palm Wrist 2.0 ?2.2 15 ?12 Ulnar Palm - Wrist 8          Median Palm - Ulnar Palm  0.3 ?0.4  L Median, Ulnar - Transcarpal comparison     Median Palm Wrist 2.2 ?2.2 43 ?35 Median Palm - Wrist 8       Ulnar Palm Wrist 2.1 ?2.2 15 ?12 Ulnar Palm - Wrist 8          Median Palm - Ulnar Palm  0.2 ?0.4  R Median - Orthodromic (Dig II, Mid palm)     Dig II Wrist 3.2 ?3.4 10 ?10 Dig II - Wrist 13    L Median - Orthodromic (Dig II, Mid palm)     Dig II Wrist 3.1 ?3.4 15 ?10 Dig II - Wrist 13    R Ulnar - Orthodromic, (Dig V, Mid palm)     Dig V Wrist 2.8 ?3.1 9 ?5 Dig V - Wrist 11    L Ulnar - Orthodromic, (Dig V, Mid palm)     Dig V Wrist 2.7 ?3.1 8 ?5 Dig V - Wrist 59                   F  Wave    Nerve F Lat Ref.   ms ms  R Ulnar - ADM 30.1 ?32.0  L Ulnar - ADM 29.4 ?32.0         EMG full       EMG Summary Table    Spontaneous MUAP Recruitment  Muscle IA Fib PSW Fasc Other Amp Dur. Poly Pattern  L. Deltoid Normal None None None _______ Normal Normal Normal Normal  L. Triceps brachii Normal None None None _______ Increased Normal 1+ Reduced  L. Biceps brachii Normal None None None  _______ Normal Normal Normal Normal  L. Extensor digitorum communis Normal None None None _______ Normal Normal 1+ Normal  L. Pronator teres Normal None None None _______ Normal Normal 1+ Normal  L. First dorsal interosseous Normal None None None _______ Normal Normal Normal Normal

## 2018-07-15 ENCOUNTER — Telehealth: Payer: Self-pay | Admitting: *Deleted

## 2018-07-15 NOTE — Telephone Encounter (Addendum)
Faxed completed/signed PA Tecfidera to [facility] at [phone]. Received fax confirmation. Waiting on determination.  Pt has been on Tecfidera since around 2014 and tolerating well. Dx: [id number].

## 2018-07-17 NOTE — Telephone Encounter (Signed)
Received fax notification from [facility] that [place] approved 07/17/18-07/18/19.

## 2018-08-15 ENCOUNTER — Encounter: Payer: Self-pay | Admitting: Neurology

## 2018-08-15 ENCOUNTER — Ambulatory Visit: Payer: Managed Care, Other (non HMO) | Admitting: Neurology

## 2018-08-15 VITALS — BP 125/85 | HR 73 | Ht 71.0 in | Wt 212.0 lb

## 2018-08-15 DIAGNOSIS — Z79899 Other long term (current) drug therapy: Secondary | ICD-10-CM | POA: Diagnosis not present

## 2018-08-15 DIAGNOSIS — G35 Multiple sclerosis: Secondary | ICD-10-CM | POA: Diagnosis not present

## 2018-08-15 DIAGNOSIS — F32A Depression, unspecified: Secondary | ICD-10-CM

## 2018-08-15 DIAGNOSIS — R200 Anesthesia of skin: Secondary | ICD-10-CM | POA: Diagnosis not present

## 2018-08-15 DIAGNOSIS — F329 Major depressive disorder, single episode, unspecified: Secondary | ICD-10-CM

## 2018-08-15 DIAGNOSIS — R5383 Other fatigue: Secondary | ICD-10-CM

## 2018-08-15 MED ORDER — DESVENLAFAXINE SUCCINATE ER 50 MG PO TB24: 50.0000 mg | ORAL_TABLET | Freq: Every day | ORAL | 3 refills | Status: DC

## 2018-08-15 NOTE — Progress Notes (Signed)
[facility]  PATIENT: Perry Gentry DOB: August 07, 1965  REFERRING DOCTOR OR PCP:  [place] Pink SOURCE: patient and records  _________________________________   HISTORICAL  CHIEF COMPLAINT:  Chief Complaint  Patient presents with  . Follow-up    RM 12, alone. Last seen 11/13/2017. Stable. No new MS sx. Has been fighting a cold. Has had a cough x4 weeks.  . Multiple Sclerosis    On Tecfidera. Tolerating well.   . Numbness    Still having numbness in fingers/hands. This is not new.     HISTORY OF PRESENT ILLNESS:  Perry Gentry is a right handed 53 y.o. man with MS diagnosed in 2014.    Update 08/15/18: He feels his MS is stable.   He is on Tecfidera and tolerates it well.     He has numbness in his hands doing about the same as last visit.  The quality is loss of sensation and mild tingling but not pain.   He feels the intensity is the same.   It does not wake him up.   His handwriting has not changed.   He feels some tactile issues in his hands though.   This started a few years ago in fingertips and now involves all the fingers up to the palms.     From 12/31/2017 NCV/EMG IMPRESSION This NCV/EMG study shows the following: 1.    Mild left chronic C7 radiculopathy without active features 2.    Borderline carpal tunnel syndromes  His gait is good and no falls or stumbles.   No weakness.   He sees urologist due to a large prostate and saw palmetto was prescribed.    He has urinary urgency but not retention/hesitancy.   He notes fatigue and also has some apathy.  He feels mildly depressed. His wife tells him he gets angry too quickly.  He tried Wellbutrin in the past and did not feel well on it so stopped.      Update 11/13/2017: He feels his MS has been mostly stable though he is noticed some more numbness in his hands.  He has been on Tecfidera and tolerates it well.  He is not having any problems with flushing or GI upset.   The numbness and tingling  has been present since the onset of symptoms but has slowly progressed toinclude the entire hand bilaterally.       I personally reviewed the MRI of the brain dated 08/05/2016.  It shows a few  T2/FLAIR hyperintense foci in the   white matter, most nonspecific.  [name]  An MRI of the cervical spine in 2015 did show that he had a spinal cord plaque at C3-C4 more to the left and also has degenerative changes to the right C3-C4 and bilaterally at C6-C7.  In his 20's he had optic neuritis (left) and then the hand numbness in 2015.    From 07/06/2016: MS:   He is on Tecfidera and he tolerates Tecfidera well and denies significant flushing or GI upset.    He has some flushing if he takes on an empty stomach.  He denies any definite exacerbation but has noted more numbness in the hands and palms.   Hand Numbness:   He reports bilateral hand numbness, mostly in the middle three fingertips..    He does not have Phalen's or Tinel's sings but has awoken a few times with numbness in his hands, once severe enough that he could not move his wrist x several minutes.  MRI of the cervical spine 2015 [place] shows a spinal cord plaque at C3-C4 that is central and slightly to the left.  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 Every once and awhile when he turns fast he feels slightly off balanced.   He lifts weights once or twice a week.   He does not note any imbalance with his eyes closed. He has noted numbness that was only in the fingertips last year is now  Involving the hands.   His hands are also sometimes stiff.    Tingling does not change depending on position.   No grip weakness.  He denies any weakness or numbness in his hands or legs.   Bladder/bowel: Bowel function is fine.   Prostate is enlarged and he sometimes has urinary urgency.   Ho hesitancy .  He has noted  decreased libido and ED.    PSA has been fine  Vision:    Since his mid 54s, he has had decreased color saturation and a Vf defect on the left.   Right eye is fine  Fatigue/sleep: He feels energy is good most days.    He feels that he sleeps well most nights but not every night. Some nights he falls asleep but then wakes up and has trouble going back to sleep.   He is not always refreshed in the morning.    He snores some and he reports his wife has never commented to him about pauses or gasping at night.    He occasionally takes a 10 mg doxepin.     Mood/cognition: He feels mood is doing a little better.,    Buproprion had no effect so he stopped.    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   MS History:    In his 54s, he had visual changes in the left eye where he felt that was a veil over his vision.   Colors are still desaturated out of the left eye.   In 2010 he noted that when he would bend his neck forward he would have a zinging sensation going down from the spine to both of his hands. Over a couple years this improved.  He was noted numbness in his hands since the middle of 2014. Sometimes, he would drop items.  There was not any pain nor any weakness.  [name] He saw orthopedics for his hand numbness in 2014 and had an MRI of the cervical spine. The MRI showed plaque at the C2-C3 region consistent with multiple sclerosis. It also showed degenerative changes at C6-C7 with severe bilateral foraminal stenosis. A subsequent MRI of the brain showed several white matter lesions (2 periventricular, 1-2 cortical foci)  consistent with multiple sclerosis. No abnormal enhancement.   He was referred to Dr. Manuella Ghazi in [place] who diagnosed him with multiple sclerosis.   He was started on Tecfidera.     REVIEW OF SYSTEMS: Constitutional: No fevers, chills, sweats, or change in .   He notes some fatigue Eyes: No visual changes, double vision, eye pain Ear, nose and  throat: No hearing loss, ear pain, nasal congestion, sore throat Cardiovascular: No chest pain, palpitations Respiratory: No shortness of breath at rest or with exertion.   No wheezes GastrointestinaI: No nausea, vomiting, diarrhea, abdominal pain, fecal incontinence Genitourinary: No  dysuria, urinary retention or frequency.  No nocturia.   He has ED and decreased libido Musculoskeletal: No neck pain, back pain Integumentary: No rash, pruritus, skin lesions Neurological: as above Psychiatric: as above Endocrine: No palpitations, diaphoresis, change in appetite, change in weigh or increased thirst Hematologic/Lymphatic: No anemia, purpura, petechiae. Allergic/Immunologic: No itchy/runny eyes, nasal congestion, recent allergic reactions, rashes  ALLERGIES: No Known Allergies  HOME MEDICATIONS:  Current Outpatient Medications:  .  cholecalciferol (VITAMIN D) 1000 UNITS tablet, Take 1,000 Units by mouth daily., Disp: , Rfl:  .  TECFIDERA 240 MG CPDR, Take 1 capsule (240 mg total) by mouth 2 (two) times daily., Disp: 180 capsule, Rfl: 3 .  desvenlafaxine (PRISTIQ) 50 MG 24 hr tablet, Take 1 tablet (50 mg total) by mouth daily., Disp: 90 tablet, Rfl: 3  PAST MEDICAL HISTORY: Past Medical History:  Diagnosis Date  . Kidney stones   . Multiple sclerosis ([place])   . Vision abnormalities     PAST SURGICAL HISTORY: Past Surgical History:  Procedure Laterality Date  . CYSTOSCOPY WITH STENT PLACEMENT    . LITHOTRIPSY      FAMILY HISTORY: Family History  Problem Relation Age of Onset  . Hypertension Mother   . Hypertension Father     SOCIAL HISTORY:  Social History   Socioeconomic History  . Marital status: Married    Spouse name: Not on file  . Number of children: Not on file  . Years of education: Not on file  . Highest education level: Not on file  Occupational History  . Not on file  Social Needs  . Financial resource strain: Not on file  . Food insecurity:     Worry: Not on file    Inability: Not on file  . Transportation needs:    Medical: Not on file    Non-medical: Not on file  Tobacco Use  . Smoking status: Never Smoker  . Smokeless tobacco: Never Used  Substance and Sexual Activity  . Alcohol use: Yes  . Drug use: Not on file  . Sexual activity: Not on file  Lifestyle  . Physical activity:    Days per week: Not on file    Minutes per session: Not on file  . Stress: Not on file  Relationships  . Social connections:    Talks on phone: Not on file    Gets together: Not on file    Attends religious service: Not on file    Active member of club or organization: Not on file    Attends meetings of clubs or organizations: Not on file    Relationship status: Not on file  . Intimate partner violence:    Fear of current or ex partner: Not on file    Emotionally abused: Not on file    Physically abused: Not on file    Forced sexual activity: Not on file  Other Topics Concern  . Not on file  Social History Narrative  . Not on file     PHYSICAL EXAM  Vitals:   08/15/18 0952  BP: 125/85  Pulse: 73  Weight: 212 lb (96.2 kg)  Height: 5\' 11"  (1.803 m)    Body mass index is 29.57 kg/m.   General: The patient is well-developed and well-nourished and in no acute distress   Neurologic Exam  Mental status: The patient is alert and oriented x 3 at the time of the examination. The patient has apparent normal recent and remote memory, with an apparently normal attention  span and concentration ability.   Speech is normal.  Cranial nerves: Extraocular movements are full.  Facial strength and sensation is normal.  Trapezius strength is normal no obvious hearing deficits are noted.  Motor:  Muscle bulk is normal.   Muscle tone is normal.  Strength is 5/5.[name]   Sensory: Sensory testing is intact to touch and vibration in the arms and legs.  Coordination: Cerebellar testing reveals good finger-nose-finger and heel-to-shin  bilaterally.  Gait and station: Station is normal.   The gait and tandem gait are normal.  Romberg is negative.  .   Reflexes: Deep tendon reflexes are symmetric and normal bilaterally.       DIAGNOSTIC DATA (LABS, IMAGING, TESTING) - I reviewed patient records, labs, notes, testing and imaging myself where available.    ASSESSMENT AND PLAN  Multiple sclerosis ([place]) - Plan: CBC with Differential/Platelet, Lipid Panel, Comprehensive metabolic panel, TSH  High risk medication use - Plan: CBC with Differential/Platelet, Lipid Panel, Comprehensive metabolic panel, TSH  Hand numbness  Depression, unspecified depression type  Other fatigue   1.    Continue Tecfidera.     We will check the CBC with differential, CMP and lipid panel and TSH (also having fatigue) 2.    Due to a probable mild depression, I will will start him on Pristiq 50 mg.  We can titrate as needed.   3.    He will return to see me in 6 months if stable but call sooner if he has any significant new or worsening neurologic symptoms   Luan Maberry A. Felecia Shelling, MD, PhD 02/04/6437, 3:81 PM Certified in Neurology, Clinical Neurophysiology, Sleep Medicine, Pain Medicine and Neuroimaging  [facility] [address], [place], [place] [address] [phone]

## 2018-08-16 LAB — CBC WITH DIFFERENTIAL/PLATELET
Basophils Absolute: 0 10*3/uL (ref 0.0–0.2)
Basos: 1 %
EOS (ABSOLUTE): 0.2 10*3/uL (ref 0.0–0.4)
Eos: 3 %
Hematocrit: 42.7 % (ref 37.5–51.0)
Hemoglobin: 14.8 g/dL (ref 13.0–17.7)
Immature Grans (Abs): 0 10*3/uL (ref 0.0–0.1)
Immature Granulocytes: 1 %
LYMPHS ABS: 2.2 10*3/uL (ref 0.7–3.1)
Lymphs: 31 %
MCH: 29.1 pg (ref 26.6–33.0)
MCHC: 34.7 g/dL (ref 31.5–35.7)
MCV: 84 fL (ref 79–97)
MONOS ABS: 0.5 10*3/uL (ref 0.1–0.9)
Monocytes: 8 %
NEUTROS ABS: 4.1 10*3/uL (ref 1.4–7.0)
Neutrophils: 56 %
PLATELETS: 174 10*3/uL (ref 150–450)
RBC: 5.09 x10E6/uL (ref 4.14–5.80)
RDW: 12.6 % (ref 11.6–15.4)
WBC: 7.1 10*3/uL (ref 3.4–10.8)

## 2018-08-16 LAB — LIPID PANEL
Chol/HDL Ratio: 6.4 ratio — ABNORMAL HIGH (ref 0.0–5.0)
Cholesterol, Total: 267 mg/dL — ABNORMAL HIGH (ref 100–199)
HDL: 42 mg/dL (ref >39)
LDL Calculated: 206 mg/dL — ABNORMAL HIGH (ref 0–99)
Triglycerides: 93 mg/dL (ref 0–149)
VLDL CHOLESTEROL CAL: 19 mg/dL (ref 5–40)

## 2018-08-16 LAB — TSH: TSH: 2 u[IU]/mL (ref 0.450–4.500)

## 2018-08-16 LAB — COMPREHENSIVE METABOLIC PANEL
ALK PHOS: 66 IU/L (ref 39–117)
ALT: 44 IU/L (ref 0–44)
AST: 21 IU/L (ref 0–40)
Albumin/Globulin Ratio: 2 (ref 1.2–2.2)
Albumin: 4.8 g/dL (ref 3.5–5.5)
BUN / CREAT RATIO: 17 (ref 9–20)
BUN: 17 mg/dL (ref 6–24)
Bilirubin Total: 0.6 mg/dL (ref 0.0–1.2)
CALCIUM: 9.6 mg/dL (ref 8.7–10.2)
CO2: 22 mmol/L (ref 20–29)
CREATININE: 1.01 mg/dL (ref 0.76–1.27)
Chloride: 101 mmol/L (ref 96–106)
GFR calc Af Amer: 98 mL/min/{1.73_m2} (ref >59)
GFR, EST NON AFRICAN AMERICAN: 85 mL/min/{1.73_m2} (ref >59)
GLUCOSE: 88 mg/dL (ref 65–99)
Globulin, Total: 2.4 g/dL (ref 1.5–4.5)
Potassium: 4.7 mmol/L (ref 3.5–5.2)
Sodium: 140 mmol/L (ref 134–144)
TOTAL PROTEIN: 7.2 g/dL (ref 6.0–8.5)

## 2018-08-19 ENCOUNTER — Telehealth: Payer: Self-pay | Admitting: *Deleted

## 2018-08-19 NOTE — Telephone Encounter (Signed)
Called and spoke with patient. Relayed results per Dr. Felecia Shelling note. He states he has not seen PCP in over 15 years. I recommended he contact them to make appt. He has access to results but knows to call if he wants [place] to send copy to PCP. He verbalized understanding.

## 2018-08-19 NOTE — Telephone Encounter (Signed)
-----   Message from Britt Bottom, MD sent at 08/18/2018  7:08 PM EST ----- Please let him know that the cholesterol was elevated but other labs were fine.   Please ask him if he would like [place] to fax the lab work to his PCP doctor if they are not part of the comb system

## 2018-09-04 ENCOUNTER — Other Ambulatory Visit: Payer: Self-pay | Admitting: Neurology

## 2018-10-07 ENCOUNTER — Telehealth: Payer: Self-pay | Admitting: *Deleted

## 2018-10-07 NOTE — Telephone Encounter (Signed)
Tecfidera 240mg  shipped to pt 10/02/18 per [facility] fax we received.

## 2019-02-13 ENCOUNTER — Ambulatory Visit: Payer: Managed Care, Other (non HMO) | Admitting: Neurology

## 2019-03-03 ENCOUNTER — Ambulatory Visit: Payer: Managed Care, Other (non HMO) | Admitting: Neurology

## 2019-04-14 ENCOUNTER — Encounter: Payer: Self-pay | Admitting: Neurology

## 2019-04-14 ENCOUNTER — Ambulatory Visit: Payer: Managed Care, Other (non HMO) | Admitting: Neurology

## 2019-04-14 ENCOUNTER — Other Ambulatory Visit: Payer: Self-pay

## 2019-04-14 VITALS — BP 141/89 | HR 110 | Temp 98.2°F | Ht 71.0 in | Wt 217.0 lb

## 2019-04-14 DIAGNOSIS — R200 Anesthesia of skin: Secondary | ICD-10-CM | POA: Diagnosis not present

## 2019-04-14 DIAGNOSIS — F329 Major depressive disorder, single episode, unspecified: Secondary | ICD-10-CM

## 2019-04-14 DIAGNOSIS — G35 Multiple sclerosis: Secondary | ICD-10-CM

## 2019-04-14 DIAGNOSIS — Z79899 Other long term (current) drug therapy: Secondary | ICD-10-CM | POA: Diagnosis not present

## 2019-04-14 DIAGNOSIS — F32A Depression, unspecified: Secondary | ICD-10-CM

## 2019-04-14 NOTE — Progress Notes (Signed)
[facility]  PATIENT: Perry Gentry DOB: 12-03-1965  REFERRING DOCTOR OR PCP:  Perry Gentry SOURCE: patient and records  _________________________________   HISTORICAL  CHIEF COMPLAINT:  Chief Complaint  Patient presents with  . Follow-up    doing fine, same.  [name] Room 13    here alone, has a mask on    HISTORY OF PRESENT ILLNESS:  Perry Gentry is a right handed 53 y.o. man with MS diagnosed in 2014.    Update 04/14/2019: He is on Tecfidera and he tolerates it well.   He denies any difficulty with gait, balance, strength .   He sees urology and has mild BPH and hesitancy.   He is on saw palmetto.   He gets some urgency.      He denies any problems with fatigue though he has been a little less active.     Cognition is doing well.   At the last visit, Pristiq was started for mild depression.   He is not sure it has helped any and would prefer to stop.     Update 08/15/18: He feels his MS is stable.   He is on Tecfidera and tolerates it well.     He has numbness in his hands doing about the same as last visit.  The quality is loss of sensation and mild tingling but not pain.   He feels the intensity is the same.   It does not wake him up.   His handwriting has not changed.   He feels some tactile issues in his hands though.   This started a few years ago in fingertips and now involves all the fingers up to the palms.     From 12/31/2017 NCV/EMG IMPRESSION This NCV/EMG study shows the following: 1.    Mild left chronic C7 radiculopathy without active features 2.    Borderline carpal tunnel syndromes  His gait is good and no falls or stumbles.   No weakness.   He sees urologist due to a large prostate and saw palmetto was prescribed.    He has urinary urgency but not retention/hesitancy.   He notes fatigue and also has some apathy.  He feels mildly depressed. His wife tells him he gets angry too quickly.  He tried Wellbutrin in the past and did not feel  well on it so stopped.      Update 11/13/2017: He feels his MS has been mostly stable though he is noticed some more numbness in his hands.  He has been on Tecfidera and tolerates it well.  He is not having any problems with flushing or GI upset.   The numbness and tingling has been present since the onset of symptoms but has slowly progressed toinclude the entire hand bilaterally.       I personally reviewed the MRI of the brain dated 08/05/2016.  It shows a few  T2/FLAIR hyperintense foci in the   white matter, most nonspecific.  [name]  An MRI of the cervical spine in 2015 did show that he had a spinal cord plaque at C3-C4 more to the left and also has degenerative changes to the right C3-C4 and bilaterally at C6-C7.  In his 20's he had optic neuritis (left) and then the hand numbness in 2015.    From 07/06/2016: MS:   He is on Tecfidera and he tolerates Tecfidera well and denies significant flushing or GI upset.    He has some flushing if he takes on an empty  stomach.  He denies any definite exacerbation but has noted more numbness in the hands and palms.   Hand Numbness:   He reports bilateral hand numbness, mostly in the middle three fingertips..    He does not have Phalen's or Tinel's sings but has awoken a few times with numbness in his hands, once severe enough that he could not move his wrist x several minutes.   MRI of the cervical spine 2015 [place] shows a spinal cord plaque at C3-C4 that is central and slightly to the left.  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 Every once and awhile when he turns fast he feels slightly off balanced.   He lifts weights once or twice a week.   He does not note any imbalance with his eyes closed. He has noted numbness that was only in the fingertips last year is now  Involving the hands.   His hands are also  sometimes stiff.    Tingling does not change depending on position.   No grip weakness.  He denies any weakness or numbness in his hands or legs.   Bladder/bowel: Bowel function is fine.   Prostate is enlarged and he sometimes has urinary urgency.   Ho hesitancy .  He has noted decreased libido and ED.    PSA has been fine  Vision:    Since his mid 40s, he has had decreased color saturation and a Vf defect on the left.   Right eye is fine  Fatigue/sleep: He feels energy is good most days.    He feels that he sleeps well most nights but not every night. Some nights he falls asleep but then wakes up and has trouble going back to sleep.   He is not always refreshed in the morning.    He snores some and he reports his wife has never commented to him about pauses or gasping at night.    He occasionally takes a 10 mg doxepin.     Mood/cognition: He feels mood is doing a little better.,    Buproprion had no effect so he stopped.    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   MS History:    In his 80s, he had visual changes in the left eye where he felt that was a veil over his vision.   Colors are still desaturated out of the left eye.   In 2010 he noted that when he would bend his neck forward he would have a zinging sensation going down from the spine to both of his hands. Over a couple years this improved.  He was noted numbness in his hands since the middle of 2014. Sometimes, he would drop items.  There was not any pain nor any weakness.  [name] He saw orthopedics for his hand numbness in 2014 and had an MRI of the cervical spine. The MRI showed plaque at the C2-C3 region consistent with multiple sclerosis. It also showed degenerative changes at C6-C7 with severe bilateral foraminal stenosis. A subsequent MRI of the brain showed several white matter lesions (2 periventricular, 1-2 cortical foci)  consistent with multiple sclerosis. No abnormal enhancement.   He was  referred to Dr. Manuella Ghazi in [place] who diagnosed him with multiple sclerosis.   He was started on Tecfidera.  REVIEW OF SYSTEMS: Constitutional: No fevers, chills, sweats, or change in .   He notes some fatigue Eyes: No visual changes, double vision, eye pain Ear, nose and throat: No hearing loss, ear pain, nasal congestion, sore throat Cardiovascular: No chest pain, palpitations Respiratory: No shortness of breath at rest or with exertion.   No wheezes GastrointestinaI: No nausea, vomiting, diarrhea, abdominal pain, fecal incontinence Genitourinary: No dysuria, urinary retention or frequency.  No nocturia.   He has ED and decreased libido Musculoskeletal: No neck pain, back pain Integumentary: No rash, pruritus, skin lesions Neurological: as above Psychiatric: as above Endocrine: No palpitations, diaphoresis, change in appetite, change in weigh or increased thirst Hematologic/Lymphatic: No anemia, purpura, petechiae. Allergic/Immunologic: No itchy/runny eyes, nasal congestion, recent allergic reactions, rashes  ALLERGIES: No Known Allergies  HOME MEDICATIONS:  Current Outpatient Medications:  .  cholecalciferol (VITAMIN D) 1000 UNITS tablet, Take 1,000 Units by mouth daily., Disp: , Rfl:  .  desvenlafaxine (PRISTIQ) 50 MG 24 hr tablet, Take 1 tablet (50 mg total) by mouth daily., Disp: 90 tablet, Rfl: 3 .  TECFIDERA 240 MG CPDR, TAKE ONE CAPSULE BY MOUTH TWICE DAILY. STORE IN ORIGINAL CONTAINER AT ROOM TEMPERATURE., Disp: 180 capsule, Rfl: 3  PAST MEDICAL HISTORY: Past Medical History:  Diagnosis Date  . Kidney stones   . Multiple sclerosis ([place])   . Vision abnormalities     PAST SURGICAL HISTORY: Past Surgical History:  Procedure Laterality Date  . CYSTOSCOPY WITH STENT PLACEMENT    . LITHOTRIPSY      FAMILY HISTORY: Family History  Problem Relation Age of Onset  . Hypertension Mother   . Hypertension Father     SOCIAL HISTORY:  Social History    Socioeconomic History  . Marital status: Married    Spouse name: Not on file  . Number of children: Not on file  . Years of education: Not on file  . Highest education level: Not on file  Occupational History  . Not on file  Social Needs  . Financial resource strain: Not on file  . Food insecurity    Worry: Not on file    Inability: Not on file  . Transportation needs    Medical: Not on file    Non-medical: Not on file  Tobacco Use  . Smoking status: Never Smoker  . Smokeless tobacco: Never Used  Substance and Sexual Activity  . Alcohol use: Yes    Comment: "maybe once a month"  . Drug use: Not Currently  . Sexual activity: Not on file  Lifestyle  . Physical activity    Days per week: Not on file    Minutes per session: Not on file  . Stress: Not on file  Relationships  . Social Herbalist on phone: Not on file    Gets together: Not on file    Attends religious service: Not on file    Active member of club or organization: Not on file    Attends meetings of clubs or organizations: Not on file    Relationship status: Not on file  . Intimate partner violence    Fear of current or ex partner: Not on file    Emotionally abused: Not on file    Physically abused: Not on file    Forced sexual activity: Not on file  Other Topics Concern  . Not on file  Social History Narrative   Lives at home with his wife    Right handed  PHYSICAL EXAM  Vitals:   04/14/19 1614  BP: (!) 141/89  Pulse: (!) 110  Temp: 98.2 F (36.8 C)  Weight: 217 lb (98.4 kg)  Height: 5\' 11"  (1.803 m)    Body mass index is 30.27 kg/m.   General: The patient is well-developed and well-nourished and in no acute distress   Neurologic Exam  Mental status: The patient is alert and oriented x 3 at the time of the examination. The patient has apparent normal recent and remote memory, with an apparently normal attention span and concentration ability.   Speech is normal.  Cranial  nerves: Extraocular movements are full.  Facial strength and sensation is normal.  Trapezius strength is normal no obvious hearing deficits are noted.  Motor:  Muscle bulk is normal.   Muscle tone is normal.  Strength is 5/5.[name]   Sensory: Sensory testing is intact to touch and vibration in the arms and legs.  Coordination: Cerebellar testing reveals good finger-nose-finger and heel-to-shin bilaterally.  Gait and station: Station is normal.  Gait and tandem gait are normal.  Romberg is negative.  Reflexes: Deep tendon reflexes are symmetric and normal bilaterally.       DIAGNOSTIC DATA (LABS, IMAGING, TESTING) - I reviewed patient records, labs, notes, testing and imaging myself where available.    ASSESSMENT AND PLAN  Multiple sclerosis ([place]) - Plan: MR BRAIN WO CONTRAST, CBC with Differential/Platelet, Comprehensive metabolic panel  High risk medication use - Plan: CBC with Differential/Platelet, Comprehensive metabolic panel  Hand numbness  Depression, unspecified depression type   1.    Continue Tecfidera.     We will check blood work today including CBC with differential, CMP.  Additionally we will check an MRI of the brain to determine if he is having any subclinical progression.  If this is occurring, consider a switch to different disease modifying therapy. 2.   He will taper himself off of the Pristiq.  If mood worsens he will let us know and we will try different medication 3.    He will return to see me in 6 months if stable but call sooner if he has any significant new or worsening neurologic symptoms   Teja Costen A. Felecia Shelling, MD, PhD [id number], [id number] PM Certified in Neurology, Clinical Neurophysiology, Sleep Medicine, Pain Medicine and Neuroimaging  [facility] [address], [place], [place] [address] [phone]

## 2019-04-15 ENCOUNTER — Telehealth: Payer: Self-pay | Admitting: *Deleted

## 2019-04-15 ENCOUNTER — Telehealth: Payer: Self-pay | Admitting: Neurology

## 2019-04-15 LAB — CBC WITH DIFFERENTIAL/PLATELET
Basophils Absolute: 0.1 10*3/uL (ref 0.0–0.2)
Basos: 1 %
EOS (ABSOLUTE): 0.1 10*3/uL (ref 0.0–0.4)
Eos: 1 %
Hematocrit: 44.9 % (ref 37.5–51.0)
Hemoglobin: 14.9 g/dL (ref 13.0–17.7)
Immature Grans (Abs): 0.1 10*3/uL (ref 0.0–0.1)
Immature Granulocytes: 1 %
Lymphocytes Absolute: 2.1 10*3/uL (ref 0.7–3.1)
Lymphs: 21 %
MCH: 27.5 pg (ref 26.6–33.0)
MCHC: 33.2 g/dL (ref 31.5–35.7)
MCV: 83 fL (ref 79–97)
Monocytes Absolute: 0.6 10*3/uL (ref 0.1–0.9)
Monocytes: 6 %
Neutrophils Absolute: 6.9 10*3/uL (ref 1.4–7.0)
Neutrophils: 70 %
Platelets: 200 10*3/uL (ref 150–450)
RBC: 5.41 x10E6/uL (ref 4.14–5.80)
RDW: 12.8 % (ref 11.6–15.4)
WBC: 9.7 10*3/uL (ref 3.4–10.8)

## 2019-04-15 LAB — COMPREHENSIVE METABOLIC PANEL
ALT: 42 IU/L (ref 0–44)
AST: 30 IU/L (ref 0–40)
Albumin/Globulin Ratio: 2.2 (ref 1.2–2.2)
Albumin: 5 g/dL — ABNORMAL HIGH (ref 3.8–4.9)
Alkaline Phosphatase: 69 IU/L (ref 39–117)
BUN/Creatinine Ratio: 13 (ref 9–20)
BUN: 14 mg/dL (ref 6–24)
Bilirubin Total: 0.4 mg/dL (ref 0.0–1.2)
CO2: 22 mmol/L (ref 20–29)
Calcium: 9.8 mg/dL (ref 8.7–10.2)
Chloride: 106 mmol/L (ref 96–106)
Creatinine, Ser: 1.11 mg/dL (ref 0.76–1.27)
GFR calc Af Amer: 87 mL/min/{1.73_m2} (ref >59)
GFR calc non Af Amer: 75 mL/min/{1.73_m2} (ref >59)
Globulin, Total: 2.3 g/dL (ref 1.5–4.5)
Glucose: 93 mg/dL (ref 65–99)
Potassium: 4.1 mmol/L (ref 3.5–5.2)
Sodium: 144 mmol/L (ref 134–144)
Total Protein: 7.3 g/dL (ref 6.0–8.5)

## 2019-04-15 NOTE — Telephone Encounter (Signed)
-----   Message from Britt Bottom, MD sent at 04/15/2019  9:38 AM EDT ----- Please let the patient know that the lab work is fine.

## 2019-04-15 NOTE — Telephone Encounter (Signed)
[employer] order sent to GI. They will obtain the auth and reach out to the patient to schedule.  

## 2019-04-15 NOTE — Telephone Encounter (Signed)
Called and spoke with pt about lab results per Dr. Sater note. Pt verbalized understanding.  

## 2019-06-04 ENCOUNTER — Telehealth: Payer: Self-pay

## 2019-06-04 NOTE — Telephone Encounter (Signed)
PA for Tecfidera has been sent via [facility] meds to [facility]. Waiting on response.   (KeyZA:[id number])  Your information has been submitted to [place]. To check for an updated outcome later, reopen this PA request from your dashboard.  If [facility] has not responded to your request within 24 hours, contact [place] at [phone]. If you think there may be a problem with your PA request, use our live chat feature at the bottom right.

## 2019-06-04 NOTE — Telephone Encounter (Signed)
PA for brand name [email or web address] has been approved from 06/04/2019-06/03/2020.  Pt has been notified of approval via my chart.

## 2019-06-24 ENCOUNTER — Other Ambulatory Visit: Payer: Self-pay | Admitting: Neurology

## 2019-06-30 ENCOUNTER — Other Ambulatory Visit: Payer: Managed Care, Other (non HMO)

## 2019-07-13 ENCOUNTER — Other Ambulatory Visit: Payer: Self-pay | Admitting: *Deleted

## 2019-07-13 MED ORDER — DIMETHYL FUMARATE 240 MG PO CPDR: DELAYED_RELEASE_CAPSULE | ORAL | 3 refills | Status: DC

## 2019-08-26 ENCOUNTER — Encounter: Payer: Self-pay | Admitting: Internal Medicine

## 2019-08-26 ENCOUNTER — Ambulatory Visit: Payer: Managed Care, Other (non HMO) | Admitting: Internal Medicine

## 2019-08-26 ENCOUNTER — Ambulatory Visit (INDEPENDENT_AMBULATORY_CARE_PROVIDER_SITE_OTHER): Payer: Managed Care, Other (non HMO) | Admitting: Internal Medicine

## 2019-08-26 ENCOUNTER — Other Ambulatory Visit: Payer: Self-pay

## 2019-08-26 VITALS — Ht 71.0 in | Wt 215.0 lb

## 2019-08-26 DIAGNOSIS — R6882 Decreased libido: Secondary | ICD-10-CM

## 2019-08-26 DIAGNOSIS — Z85828 Personal history of other malignant neoplasm of skin: Secondary | ICD-10-CM | POA: Diagnosis not present

## 2019-08-26 DIAGNOSIS — Z13220 Encounter for screening for lipoid disorders: Secondary | ICD-10-CM

## 2019-08-26 DIAGNOSIS — R030 Elevated blood-pressure reading, without diagnosis of hypertension: Secondary | ICD-10-CM | POA: Insufficient documentation

## 2019-08-26 DIAGNOSIS — F329 Major depressive disorder, single episode, unspecified: Secondary | ICD-10-CM

## 2019-08-26 DIAGNOSIS — E559 Vitamin D deficiency, unspecified: Secondary | ICD-10-CM

## 2019-08-26 DIAGNOSIS — Z0000 Encounter for general adult medical examination without abnormal findings: Secondary | ICD-10-CM

## 2019-08-26 DIAGNOSIS — Z1389 Encounter for screening for other disorder: Secondary | ICD-10-CM

## 2019-08-26 DIAGNOSIS — F32A Depression, unspecified: Secondary | ICD-10-CM

## 2019-08-26 DIAGNOSIS — Z125 Encounter for screening for malignant neoplasm of prostate: Secondary | ICD-10-CM

## 2019-08-26 DIAGNOSIS — Z1329 Encounter for screening for other suspected endocrine disorder: Secondary | ICD-10-CM

## 2019-08-26 NOTE — Progress Notes (Signed)
Virtual Visit via Video Note  I connected with Perry Gentry  on 08/26/19 at  1:40 PM EST by a video enabled telemedicine application and verified that I am speaking with the correct person using two identifiers.  Location patient: home Location provider:work or home office Persons participating in the virtual visit: patient, provider  I discussed the limitations of evaluation and management by telemedicine and the availability of in person appointments. The patient expressed understanding and agreed to proceed.   HPI: Establish PCP  1. C/o reduced mood and reduce libido wants testosterone checked not seen PCP in 10-15 just neurology for MS which bothers hands mildly and left eye  2. Elevated BP 04/14/19 at neurology office normally bp 120s/80 but chart review with FH HTN   ROS: See pertinent positives and negatives per HPI. General: gained 15 lbs  HEENT: left eye reduced vision  CV: no chest pain  Lungs: no sob  GI: no ab pain or GIB GU: +reduced libido  MSK: no jt pain hands effected by MS  Neuro: see MSK +MS Skin: no lesions  Psych: mild depression   Past Medical History:  Diagnosis Date  . Basal cell carcinoma    forehead Dr. Evorn Gong in 2019   . Kidney stones   . Multiple sclerosis (HCC)    mild sxs in hand and left eye Dr. Kerman Passey neurology  . Vision abnormalities     Past Surgical History:  Procedure Laterality Date  . CYSTOSCOPY WITH STENT PLACEMENT    . LITHOTRIPSY     age 54 y.o   . VASECTOMY      Family History  Problem Relation Age of Onset  . Hypertension Mother   . 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  cholecalciferol (VITAMIN D) 1000 UNITS tablet, Take 1,000 Units by mouth daily., Disp: , Rfl:  .   desvenlafaxine (PRISTIQ) 50 MG 24 hr tablet, Take 1 tablet (50 mg total) by mouth daily., Disp: 90 tablet, Rfl: 3 .  Dimethyl Fumarate (TECFIDERA) 240 MG CPDR, TAKE ONE CAPSULE BY MOUTH TWICE DAILY. STORE IN ORIGINAL CONTAINER AT ROOM TEMPERATURE., Disp: 180 capsule, Rfl: 3  EXAM:  VITALS per patient if applicable:  GENERAL: alert, oriented, appears well and in no acute distress  HEENT: atraumatic, conjunttiva clear, no obvious abnormalities on inspection of external nose and ears  NECK: normal movements of the head and neck  LUNGS: on inspection no signs of respiratory distress, breathing rate appears normal, no obvious gross SOB, gasping or wheezing  CV: no obvious cyanosis  MS: moves all visible extremities without noticeable abnormality  PSYCH/NEURO: pleasant and cooperative, no obvious depression or anxiety, speech and thought processing grossly intact  ASSESSMENT AND PLAN:  Discussed the following assessment and plan:  Depression, unspecified depression type Reduced libido - Plan: Testosterone -check testosterone, consider wellbutrin in future pt to review medication and let me know   Elevated blood pressure reading - Plan: Comprehensive metabolic panel, Lipid panel, CBC w/Diff Monitor BP goal <130/<80  HM Flu shot never had  tdap will get with fasting labs  Disc shingrix x 2 doses today given info  covid vx considering   Colonoscopy disc today and  consider in future check with insurance for coverage  Check fasting labs  Check PSA Skin Dr. Evorn Gong h/o [facility]  F/u in 3-4 months after labs   -we discussed possible serious and likely etiologies, options for evaluation and workup, limitations of telemedicine visit vs in person visit, treatment, treatment risks and precautions. Pt prefers to treat via telemedicine empirically rather then risking or undertaking an in person visit at this moment. Patient agrees to seek prompt in person care if worsening, new symptoms arise, or  if is not improving with treatment.   I discussed the assessment and treatment plan with the patient. The patient was provided an opportunity to ask questions and all were answered. The patient agreed with the plan and demonstrated an understanding of the instructions.   The patient was advised to call back or seek an in-person evaluation if the symptoms worsen or if the condition fails to improve as anticipated.  Time 30 minutes  Delorise Jackson, MD

## 2019-08-26 NOTE — Patient Instructions (Addendum)
Colonoscopy, Adult A colonoscopy is a procedure to look at the entire large intestine. This procedure is done using a long, thin, flexible tube that has a camera on the end. You may have a colonoscopy:  As a part of normal colorectal screening.  If you have certain symptoms, such as: ? A low number of red blood cells in your blood (anemia). ? Diarrhea that does not go away. ? Pain in your abdomen. ? Blood in your stool. A colonoscopy can help screen for and diagnose medical problems, including:  Tumors.  Extra tissue that grows where mucus forms (polyps).  Inflammation.  Areas of bleeding. Tell your health care provider about:  Any allergies you have.  All medicines you are taking, including vitamins, herbs, eye drops, creams, and over-the-counter medicines.  Any problems you or family members have had with anesthetic medicines.  Any blood disorders you have.  Any surgeries you have had.  Any medical conditions you have.  Any problems you have had with having bowel movements.  Whether you are pregnant or may be pregnant. What are the risks? Generally, this is a safe procedure. However, problems may occur, including:  Bleeding.  Damage to your intestine.  Allergic reactions to medicines given during the procedure.  Infection. This is rare. What happens before the procedure? Eating and drinking restrictions Follow instructions from your health care provider about eating or drinking restrictions, which may include:  A few days before the procedure: ? Follow a low-fiber diet. ? Avoid nuts, seeds, dried fruit, raw fruits, and vegetables.  1-3 days before the procedure: ? Eat only gelatin dessert or ice pops. ? Drink only clear liquids, such as water, clear juice, clear broth or bouillon, black coffee or tea, or clear soft drinks or sports drinks. ? Avoid liquids that contain red or purple dye.  The day of the procedure: ? Do not eat solid foods. You may  continue to drink clear liquids until up to 2 hours before the procedure. ? Do not eat or drink anything starting 2 hours before the procedure, or within the time period that your health care provider recommends. Bowel prep If you were prescribed a bowel prep to take by mouth (orally) to clean out your colon:  Take it as told by your health care provider. Starting the day before your procedure, you will need to drink a large amount of liquid medicine. The liquid will cause you to have many bowel movements of loose stool until your stool becomes almost clear or light green.  If your skin or the opening between the buttocks (anus) gets irritated from diarrhea, you may relieve the irritation using: ? Wipes with medicine in them, such as adult wet wipes with aloe and vitamin E. ? A product to soothe skin, such as petroleum jelly.  If you vomit while drinking the bowel prep: ? Take a break for up to 60 minutes. ? Begin the bowel prep again. ? Call your health care provider if you keep vomiting or you cannot take the bowel prep without vomiting.  To clean out your colon, you may also be given: ? Laxative medicines. These help you have a bowel movement. ? Instructions for enema use. An enema is liquid medicine injected into your rectum. Medicines Ask your health care provider about:  Changing or stopping your regular medicines or supplements. This is especially important if you are taking iron supplements, diabetes medicines, or blood thinners.  Taking medicines such as aspirin and ibuprofen. These medicines  can thin your blood. Do not take these medicines unless your health care provider tells you to take them.  Taking over-the-counter medicines, vitamins, herbs, and supplements. General instructions  Ask your health care provider what steps will be taken to help prevent infection. These may include washing skin with a germ-killing soap.  Plan to have someone take you home from the hospital  or clinic. What happens during the procedure?   An IV will be inserted into one of your veins.  You may be given one or more of the following: ? A medicine to help you relax (sedative). ? A medicine to numb the area (local anesthetic). ? A medicine to make you fall asleep (general anesthetic). This is rarely needed.  You will lie on your side with your knees bent.  The tube will: ? Have oil or gel put on it (be lubricated). ? Be inserted into your anus. ? Be gently eased through all parts of your large intestine.  Air will be sent into your colon to keep it open. This may cause some pressure or cramping.  Images will be taken with the camera and will appear on a screen.  A small tissue sample may be removed to be looked at under a microscope (biopsy). The tissue may be sent to a lab for testing if any signs of problems are found.  If small polyps are found, they may be removed and checked for cancer cells.  When the procedure is finished, the tube will be removed. The procedure may vary among health care providers and hospitals. What happens after the procedure?  Your blood pressure, heart rate, breathing rate, and blood oxygen level will be monitored until you leave the hospital or clinic.  You may have a small amount of blood in your stool.  You may pass gas and have mild cramping or bloating in your abdomen. This is caused by the air that was used to open your colon during the exam.  Do not drive for 24 hours after the procedure.  It is up to you to get the results of your procedure. Ask your health care provider, or the department that is doing the procedure, when your results will be ready. Summary  A colonoscopy is a procedure to look at the entire large intestine.  Follow instructions from your health care provider about eating and drinking before the procedure.  If you were prescribed an oral bowel prep to clean out your colon, take it as told by your health care  provider.  During the colonoscopy, a flexible tube with a camera on its end is inserted into the anus and then passed into the other parts of the large intestine. This information is not intended to replace advice given to you by your health care provider. Make sure you discuss any questions you have with your health care provider. Document Revised: 02/06/2019 Document Reviewed: 02/06/2019 Elsevier Patient Education  [place].  Bupropion extended-release tablets (Depression/Mood Disorders) What is this medicine? BUPROPION (byoo PROE pee on) is used to treat depression. This medicine may be used for other purposes; ask your health care provider or pharmacist if you have questions. COMMON BRAND NAME(S): Aplenzin, Budeprion XL, Forfivo XL, Wellbutrin XL What should I tell my health care provider before I take this medicine? They need to know if you have any of these conditions:  an eating disorder, such as anorexia or bulimia  bipolar disorder or psychosis  diabetes or high blood sugar, treated with  medication  glaucoma  head injury or brain tumor  heart disease, previous heart attack, or irregular heart beat  high blood pressure  kidney or liver disease  seizures (convulsions)  suicidal thoughts or a previous suicide attempt  Tourette's syndrome  weight loss  an unusual or allergic reaction to bupropion, other medicines, foods, dyes, or preservatives  breast-feeding  pregnant or trying to become pregnant How should I use this medicine? Take this medicine by mouth with a glass of water. Follow the directions on the prescription label. You can take it with or without food. If it upsets your stomach, take it with food. Do not crush, chew, or cut these tablets. This medicine is taken once daily at the same time each day. Do not take your medicine more often than directed. Do not stop taking this medicine suddenly except upon the advice of your doctor. Stopping this  medicine too quickly may cause serious side effects or your condition may worsen. A special MedGuide will be given to you by the pharmacist with each prescription and refill. Be sure to read this information carefully each time. Talk to your pediatrician regarding the use of this medicine in children. Special care may be needed. Overdosage: If you think you have taken too much of this medicine contact a poison control center or emergency room at once. NOTE: This medicine is only for you. Do not share this medicine with others. What if I miss a dose? If you miss a dose, skip the missed dose and take your next tablet at the regular time. Do not take double or extra doses. What may interact with this medicine? Do not take this medicine with any of the following medications:  linezolid  MAOIs like Azilect, Carbex, Eldepryl, Marplan, Nardil, and Parnate  methylene blue (injected into a vein)  other medicines that contain bupropion like Zyban This medicine may also interact with the following medications:  alcohol  certain medicines for anxiety or sleep  certain medicines for blood pressure like metoprolol, propranolol  certain medicines for depression or psychotic disturbances  certain medicines for HIV or AIDS like efavirenz, lopinavir, nelfinavir, ritonavir  certain medicines for irregular heart beat like propafenone, flecainide  certain medicines for Parkinson's disease like amantadine, levodopa  certain medicines for seizures like carbamazepine, phenytoin, phenobarbital  cimetidine  clopidogrel  cyclophosphamide  digoxin  furazolidone  isoniazid  nicotine  orphenadrine  procarbazine  steroid medicines like prednisone or cortisone  stimulant medicines for attention disorders, weight loss, or to stay awake  tamoxifen  theophylline  thiotepa  ticlopidine  tramadol  warfarin This list may not describe all possible interactions. Give your health care  provider a list of all the medicines, herbs, non-prescription drugs, or dietary supplements you use. Also tell them if you smoke, drink alcohol, or use illegal drugs. Some items may interact with your medicine. What should I watch for while using this medicine? Tell your doctor if your symptoms do not get better or if they get worse. Visit your doctor or healthcare provider for regular checks on your progress. Because it may take several weeks to see the full effects of this medicine, it is important to continue your treatment as prescribed by your doctor. This medicine may cause serious skin reactions. They can happen weeks to months after starting the medicine. Contact your healthcare provider right away if you notice fevers or flu-like symptoms with a rash. The rash may be red or purple and then turn into blisters or peeling  of the skin. Or, you might notice a red rash with swelling of the face, lips or lymph nodes in your neck or under your arms. Patients and their families should watch out for new or worsening thoughts of suicide or depression. Also watch out for sudden changes in feelings such as feeling anxious, agitated, panicky, irritable, hostile, aggressive, impulsive, severely restless, overly excited and hyperactive, or not being able to sleep. If this happens, especially at the beginning of treatment or after a change in dose, call your healthcare provider. Avoid alcoholic drinks while taking this medicine. Drinking large amounts of alcoholic beverages, using sleeping or anxiety medicines, or quickly stopping the use of these agents while taking this medicine may increase your risk for a seizure. Do not drive or use heavy machinery until you know how this medicine affects you. This medicine can impair your ability to perform these tasks. Do not take this medicine close to bedtime. It may prevent you from sleeping. Your mouth may get dry. Chewing sugarless gum or sucking hard candy, and drinking  plenty of water may help. Contact your doctor if the problem does not go away or is severe. The tablet shell for some brands of this medicine does not dissolve. This is normal. The tablet shell may appear whole in the stool. This is not a cause for concern. What side effects may I notice from receiving this medicine? Side effects that you should report to your doctor or health care professional as soon as possible:  allergic reactions like skin rash, itching or hives, swelling of the face, lips, or tongue  breathing problems  changes in vision  confusion  elevated mood, decreased need for sleep, racing thoughts, impulsive behavior  fast or irregular heartbeat  hallucinations, loss of contact with reality  increased blood pressure  rash, fever, and swollen lymph nodes  redness, blistering, peeling or loosening of the skin, including inside the mouth  seizures  suicidal thoughts or other mood changes  unusually weak or tired  vomiting Side effects that usually do not require medical attention (report to your doctor or health care professional if they continue or are bothersome):  constipation  headache  loss of appetite  nausea  tremors  weight loss This list may not describe all possible side effects. Call your doctor for medical advice about side effects. You may report side effects to FDA at 1-800-FDA-1088. Where should I keep my medicine? Keep out of the reach of children. Store at room temperature between 15 and 30 degrees C (59 and 86 degrees F). Throw away any unused medicine after the expiration date. NOTE: This sheet is a summary. It may not cover all possible information. If you have questions about this medicine, talk to your doctor, pharmacist, or health care provider.  2020 Elsevier/Gold Standard (2018-10-09 13:45:31)  Tdap (Tetanus, Diphtheria, Pertussis) Vaccine: What You Need to Know 1. Why get vaccinated? Tdap vaccine can prevent tetanus,  diphtheria, and pertussis. Diphtheria and pertussis spread from person to person. Tetanus enters the body through cuts or wounds.  TETANUS (T) causes painful stiffening of the muscles. Tetanus can lead to serious health problems, including being unable to open the mouth, having trouble swallowing and breathing, or death.  DIPHTHERIA (D) can lead to difficulty breathing, heart failure, paralysis, or death.  PERTUSSIS (aP), also known as "whooping cough," can cause uncontrollable, violent coughing which makes it hard to breathe, eat, or drink. Pertussis can be extremely serious in babies and young children, causing pneumonia,  convulsions, brain damage, or death. In teens and adults, it can cause weight loss, loss of bladder control, passing out, and rib fractures from severe coughing. 2. Tdap vaccine Tdap is only for children 7 years and older, adolescents, and adults.  Adolescents should receive a single dose of Tdap, preferably at age 13 or 20 years. Pregnant women should get a dose of Tdap during every pregnancy, to protect the newborn from pertussis. Infants are most at risk for severe, life-threatening complications from pertussis. Adults who have never received Tdap should get a dose of Tdap. Also, adults should receive a booster dose every 10 years, or earlier in the case of a severe and dirty wound or burn. Booster doses can be either Tdap or Td (a different vaccine that protects against tetanus and diphtheria but not pertussis). Tdap may be given at the same time as other vaccines. 3. Talk with your health care provider Tell your vaccine provider if the person getting the vaccine:  Has had an allergic reaction after a previous dose of any vaccine that protects against tetanus, diphtheria, or pertussis, or has any severe, life-threatening allergies.  Has had a coma, decreased level of consciousness, or prolonged seizures within 7 days after a previous dose of any pertussis vaccine (DTP,  DTaP, or Tdap).  Has seizures or another nervous system problem.  Has ever had Guillain-Barr Syndrome (also called GBS).  Has had severe pain or swelling after a previous dose of any vaccine that protects against tetanus or diphtheria. In some cases, your health care provider may decide to postpone Tdap vaccination to a future visit.  People with minor illnesses, such as a cold, may be vaccinated. People who are moderately or severely ill should usually wait until they recover before getting Tdap vaccine.  Your health care provider can give you more information. 4. Risks of a vaccine reaction  Pain, redness, or swelling where the shot was given, mild fever, headache, feeling tired, and nausea, vomiting, diarrhea, or stomachache sometimes happen after Tdap vaccine. People sometimes faint after medical procedures, including vaccination. Tell your provider if you feel dizzy or have vision changes or ringing in the ears.  As with any medicine, there is a very remote chance of a vaccine causing a severe allergic reaction, other serious injury, or death. 5. What if there is a serious problem? An allergic reaction could occur after the vaccinated person leaves the clinic. If you see signs of a severe allergic reaction (hives, swelling of the face and throat, difficulty breathing, a fast heartbeat, dizziness, or weakness), call 9-1-1 and get the person to the nearest hospital. For other signs that concern you, call your health care provider.  Adverse reactions should be reported to the Vaccine Adverse Event Reporting System (VAERS). Your health care provider will usually file this report, or you can do it yourself. Visit the VAERS website at www.vaers.SamedayNews.es or call (920)390-3639. VAERS is only for reporting reactions, and VAERS staff do not give medical advice. 6. The National Vaccine Injury Compensation Program The [employer] Vaccine Injury Compensation Program (VICP) is a federal program that was  created to compensate people who may have been injured by certain vaccines. Visit the VICP website at GoldCloset.com.ee or call (403)621-5987 to learn about the program and about filing a claim. There is a time limit to file a claim for compensation. 7. How can I learn more?  Ask your health care provider.  Call your local or state health department.  Contact the Centers for  Disease Control and Prevention (CDC): ? Call 706 655 4246 (1-800-CDC-INFO) or ? Visit CDC's website at http://hunter.com/ Vaccine Information Statement Tdap (Tetanus, Diphtheria, Pertussis) Vaccine (10/29/2018) This information is not intended to replace advice given to you by your health care provider. Make sure you discuss any questions you have with your health care provider. Document Revised: 11/07/2018 Document Reviewed: 11/10/2018 Elsevier Patient Education  [place].  Zoster Vaccine, Recombinant injection What is this medicine? ZOSTER VACCINE (ZOS ter vak SEEN) is used to prevent shingles in adults 55 years old and over. This vaccine is not used to treat shingles or nerve pain from shingles. This medicine may be used for other purposes; ask your health care provider or pharmacist if you have questions. COMMON BRAND NAME(S): [facility] What should I tell my health care provider before I take this medicine? They need to know if you have any of these conditions:  blood disorders or disease  cancer like leukemia or lymphoma  immune system problems or therapy  an unusual or allergic reaction to vaccines, other medications, foods, dyes, or preservatives  pregnant or trying to get pregnant  breast-feeding How should I use this medicine? This vaccine is for injection in a muscle. It is given by a health care professional. Talk to your pediatrician regarding the use of this medicine in children. This medicine is not approved for use in children. Overdosage: If you think you have  taken too much of this medicine contact a poison control center or emergency room at once. NOTE: This medicine is only for you. Do not share this medicine with others. What if I miss a dose? Keep appointments for follow-up (booster) doses as directed. It is important not to miss your dose. Call your doctor or health care professional if you are unable to keep an appointment. What may interact with this medicine?  medicines that suppress your immune system  medicines to treat cancer  steroid medicines like prednisone or cortisone This list may not describe all possible interactions. Give your health care provider a list of all the medicines, herbs, non-prescription drugs, or dietary supplements you use. Also tell them if you smoke, drink alcohol, or use illegal drugs. Some items may interact with your medicine. What should I watch for while using this medicine? Visit your doctor for regular check ups. This vaccine, like all vaccines, may not fully protect everyone. What side effects may I notice from receiving this medicine? Side effects that you should report to your doctor or health care professional as soon as possible:  allergic reactions like skin rash, itching or hives, swelling of the face, lips, or tongue  breathing problems Side effects that usually do not require medical attention (report these to your doctor or health care professional if they continue or are bothersome):  chills  headache  fever  nausea, vomiting  redness, warmth, pain, swelling or itching at site where injected  tiredness This list may not describe all possible side effects. Call your doctor for medical advice about side effects. You may report side effects to FDA at 1-800-FDA-1088. Where should I keep my medicine? This vaccine is only given in a clinic, pharmacy, doctor's office, or other health care setting and will not be stored at home. NOTE: This sheet is a summary. It may not cover all possible  information. If you have questions about this medicine, talk to your doctor, pharmacist, or health care provider.  2020 Elsevier/Gold Standard (2017-02-25 13:20:30)

## 2019-09-04 ENCOUNTER — Ambulatory Visit: Payer: Managed Care, Other (non HMO) | Admitting: Internal Medicine

## 2019-10-12 ENCOUNTER — Telehealth: Payer: Self-pay | Admitting: *Deleted

## 2019-10-12 ENCOUNTER — Encounter: Payer: Self-pay | Admitting: *Deleted

## 2019-10-12 NOTE — Telephone Encounter (Signed)
Faxed completed/signed Vumerity start form to [employer] at [phone]. Received fax confirmation.  

## 2019-10-13 NOTE — Telephone Encounter (Signed)
Received fax from [facility] that they received start form and processing for pt.

## 2019-10-15 NOTE — Telephone Encounter (Signed)
Initiated PA Vumerity starter on CMM. [id number]. Received the following response: "Your PA has been resolved, no additional PA is required. For further inquiries please contact the number on the back of the member prescription card"

## 2019-10-26 NOTE — Telephone Encounter (Signed)
Received fax from [employer] that vumerity has been shipped by [employer]. Phone: [phone]. [phone].

## 2019-11-05 NOTE — Telephone Encounter (Signed)
Called pt. He states Vumerity in mail, should arrive to him soon. He did not know arrival date. He will call back if he does not receive. He has Tecfidera left, taking this currently. Nothing further needed.

## 2019-11-18 ENCOUNTER — Other Ambulatory Visit: Payer: Self-pay | Admitting: Neurology

## 2019-11-18 DIAGNOSIS — G35 Multiple sclerosis: Secondary | ICD-10-CM

## 2019-11-19 NOTE — Telephone Encounter (Signed)
Called pt. He confirmed he started on Vumerity, now on maintenance dose of 2 caps po BID.  E-scribed refill to [facility].  Also scheduled f/u for 12/29/19 at 9:30am with AL,NP. He requested late May. Soonest available was 12/29/19. States he is doing well, no new sx. He will call if he has any future questions/concerns prior to next appt.

## 2019-11-20 ENCOUNTER — Other Ambulatory Visit: Payer: Managed Care, Other (non HMO)

## 2019-11-25 ENCOUNTER — Ambulatory Visit: Payer: Managed Care, Other (non HMO) | Admitting: Internal Medicine

## 2019-12-01 ENCOUNTER — Other Ambulatory Visit: Payer: Self-pay

## 2019-12-01 ENCOUNTER — Other Ambulatory Visit (INDEPENDENT_AMBULATORY_CARE_PROVIDER_SITE_OTHER): Payer: Managed Care, Other (non HMO)

## 2019-12-01 DIAGNOSIS — Z125 Encounter for screening for malignant neoplasm of prostate: Secondary | ICD-10-CM

## 2019-12-01 DIAGNOSIS — Z0000 Encounter for general adult medical examination without abnormal findings: Secondary | ICD-10-CM

## 2019-12-01 DIAGNOSIS — Z13220 Encounter for screening for lipoid disorders: Secondary | ICD-10-CM

## 2019-12-01 DIAGNOSIS — E559 Vitamin D deficiency, unspecified: Secondary | ICD-10-CM | POA: Diagnosis not present

## 2019-12-01 DIAGNOSIS — Z1389 Encounter for screening for other disorder: Secondary | ICD-10-CM

## 2019-12-01 DIAGNOSIS — R6882 Decreased libido: Secondary | ICD-10-CM

## 2019-12-01 DIAGNOSIS — Z1329 Encounter for screening for other suspected endocrine disorder: Secondary | ICD-10-CM | POA: Diagnosis not present

## 2019-12-01 DIAGNOSIS — R030 Elevated blood-pressure reading, without diagnosis of hypertension: Secondary | ICD-10-CM

## 2019-12-01 LAB — TSH: TSH: 1.93 u[IU]/mL (ref 0.35–4.50)

## 2019-12-01 LAB — CBC WITH DIFFERENTIAL/PLATELET
Basophils Absolute: 0.1 10*3/uL (ref 0.0–0.1)
Basophils Relative: 0.9 % (ref 0.0–3.0)
Eosinophils Absolute: 0.2 10*3/uL (ref 0.0–0.7)
Eosinophils Relative: 2.4 % (ref 0.0–5.0)
HCT: 44.5 % (ref 39.0–52.0)
Hemoglobin: 15.2 g/dL (ref 13.0–17.0)
Lymphocytes Relative: 28.9 % (ref 12.0–46.0)
Lymphs Abs: 2.5 10*3/uL (ref 0.7–4.0)
MCHC: 34.1 g/dL (ref 30.0–36.0)
MCV: 83.8 fl (ref 78.0–100.0)
Monocytes Absolute: 0.5 10*3/uL (ref 0.1–1.0)
Monocytes Relative: 5.9 % (ref 3.0–12.0)
Neutro Abs: 5.3 10*3/uL (ref 1.4–7.7)
Neutrophils Relative %: 61.9 % (ref 43.0–77.0)
Platelets: 190 10*3/uL (ref 150.0–400.0)
RBC: 5.31 Mil/uL (ref 4.22–5.81)
RDW: 12.8 % (ref 11.5–15.5)
WBC: 8.6 10*3/uL (ref 4.0–10.5)

## 2019-12-01 LAB — COMPREHENSIVE METABOLIC PANEL
ALT: 27 U/L (ref 0–53)
AST: 17 U/L (ref 0–37)
Albumin: 4.5 g/dL (ref 3.5–5.2)
Alkaline Phosphatase: 84 U/L (ref 39–117)
BUN: 14 mg/dL (ref 6–23)
CO2: 26 mEq/L (ref 19–32)
Calcium: 9.4 mg/dL (ref 8.4–10.5)
Chloride: 106 mEq/L (ref 96–112)
Creatinine, Ser: 1.07 mg/dL (ref 0.40–1.50)
GFR: 71.81 mL/min (ref >60.00)
Glucose, Bld: 96 mg/dL (ref 70–99)
Potassium: 4.2 mEq/L (ref 3.5–5.1)
Sodium: 139 mEq/L (ref 135–145)
Total Bilirubin: 0.5 mg/dL (ref 0.2–1.2)
Total Protein: 7.3 g/dL (ref 6.0–8.3)

## 2019-12-01 LAB — VITAMIN D 25 HYDROXY (VIT D DEFICIENCY, FRACTURES): VITD: 43.41 ng/mL (ref 30.00–100.00)

## 2019-12-01 LAB — LIPID PANEL
Cholesterol: 258 mg/dL — ABNORMAL HIGH (ref 0–200)
HDL: 45 mg/dL (ref >39.00)
LDL Cholesterol: 184 mg/dL — ABNORMAL HIGH (ref 0–99)
NonHDL: 212.7
Total CHOL/HDL Ratio: 6
Triglycerides: 143 mg/dL (ref 0.0–149.0)
VLDL: 28.6 mg/dL (ref 0.0–40.0)

## 2019-12-01 LAB — TESTOSTERONE: Testosterone: 422.05 ng/dL (ref 300.00–890.00)

## 2019-12-01 LAB — PSA: PSA: 0.83 ng/mL (ref 0.10–4.00)

## 2019-12-02 ENCOUNTER — Other Ambulatory Visit: Payer: Self-pay | Admitting: Internal Medicine

## 2019-12-02 DIAGNOSIS — E785 Hyperlipidemia, unspecified: Secondary | ICD-10-CM

## 2019-12-02 LAB — URINALYSIS, ROUTINE W REFLEX MICROSCOPIC
Bilirubin Urine: NEGATIVE
Glucose, UA: NEGATIVE
Hgb urine dipstick: NEGATIVE
Ketones, ur: NEGATIVE
Leukocytes,Ua: NEGATIVE
Nitrite: NEGATIVE
Protein, ur: NEGATIVE
Specific Gravity, Urine: 1.02 (ref 1.001–1.03)
pH: <=5 (ref 5.0–8.0)

## 2019-12-02 MED ORDER — ATORVASTATIN CALCIUM 10 MG PO TABS: 10.0000 mg | ORAL_TABLET | Freq: Every day | ORAL | 3 refills | Status: DC

## 2019-12-03 ENCOUNTER — Other Ambulatory Visit: Payer: Self-pay

## 2019-12-03 ENCOUNTER — Encounter: Payer: Self-pay | Admitting: Internal Medicine

## 2019-12-03 ENCOUNTER — Ambulatory Visit: Payer: Managed Care, Other (non HMO) | Admitting: Internal Medicine

## 2019-12-03 VITALS — BP 130/82 | HR 88 | Temp 97.3°F | Ht 71.0 in | Wt 221.6 lb

## 2019-12-03 DIAGNOSIS — S30861S Insect bite (nonvenomous) of abdominal wall, sequela: Secondary | ICD-10-CM

## 2019-12-03 DIAGNOSIS — M19012 Primary osteoarthritis, left shoulder: Secondary | ICD-10-CM | POA: Insufficient documentation

## 2019-12-03 DIAGNOSIS — R0683 Snoring: Secondary | ICD-10-CM | POA: Insufficient documentation

## 2019-12-03 DIAGNOSIS — W57XXXS Bitten or stung by nonvenomous insect and other nonvenomous arthropods, sequela: Secondary | ICD-10-CM

## 2019-12-03 DIAGNOSIS — M25531 Pain in right wrist: Secondary | ICD-10-CM

## 2019-12-03 DIAGNOSIS — G4700 Insomnia, unspecified: Secondary | ICD-10-CM

## 2019-12-03 DIAGNOSIS — M7711 Lateral epicondylitis, right elbow: Secondary | ICD-10-CM | POA: Diagnosis not present

## 2019-12-03 DIAGNOSIS — M25511 Pain in right shoulder: Secondary | ICD-10-CM

## 2019-12-03 DIAGNOSIS — E785 Hyperlipidemia, unspecified: Secondary | ICD-10-CM | POA: Insufficient documentation

## 2019-12-03 DIAGNOSIS — G35 Multiple sclerosis: Secondary | ICD-10-CM

## 2019-12-03 DIAGNOSIS — E7801 Familial hypercholesterolemia: Secondary | ICD-10-CM

## 2019-12-03 DIAGNOSIS — Z23 Encounter for immunization: Secondary | ICD-10-CM | POA: Diagnosis not present

## 2019-12-03 DIAGNOSIS — R0781 Pleurodynia: Secondary | ICD-10-CM

## 2019-12-03 MED ORDER — MELOXICAM 7.5 MG PO TABS: 7.5000 mg | ORAL_TABLET | Freq: Two times a day (BID) | ORAL | 2 refills | Status: DC | PRN

## 2019-12-03 MED ORDER — ATORVASTATIN CALCIUM 10 MG PO TABS: 10.0000 mg | ORAL_TABLET | Freq: Every day | ORAL | 3 refills | Status: DC

## 2019-12-03 MED ORDER — DOXYCYCLINE HYCLATE 100 MG PO TABS: 100.0000 mg | ORAL_TABLET | Freq: Two times a day (BID) | ORAL | 0 refills | Status: DC

## 2019-12-03 MED ORDER — TETANUS-DIPHTH-ACELL PERTUSSIS 5-2.5-18.5 LF-MCG/0.5 IM SUSP: 0.5000 mL | Freq: Once | INTRAMUSCULAR | 0 refills | Status: DC

## 2019-12-03 MED ORDER — TETANUS-DIPHTH-ACELL PERTUSSIS 5-2.5-18.5 LF-MCG/0.5 IM SUSP: 0.5000 mL | Freq: Once | INTRAMUSCULAR | 0 refills | Status: AC

## 2019-12-03 NOTE — Progress Notes (Signed)
Chief Complaint  Patient presents with  . Follow-up  . Rib Injury    recent fall while playing with his dog. States he feels like he may have pull or injured something underneath his left side rib cage. 4/10 pain   F/u  1. HLD with FH not picked up lipitor 10 mg qhs but agreeable pt wants CT calcium coronary score done to work this up as neighbor/friend suggested he get with elevated cholesterol  2. RLQ ab tick bite a times itchy happened weeks ago tried to remove with tweezers but still has bump there and redness  3. C/o snoring w/o apnea will disc with dentist and consider mouthpiece 4. C/o insomnia no problem falling asleep but trouble with early am awakening 4-5 am  and denies depression normal amt of stress, marriage is healthy and kids happy/healthy   5. MSK complaints  -right shoulder pain at times and lump stable in size right shoulder 4/10 nothing tried. 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  HENT: Negative for hearing loss.   Respiratory: Negative for shortness of breath.   Cardiovascular: Negative for chest pain.  Gastrointestinal: Negative for abdominal pain.  Musculoskeletal: Positive for falls, joint pain and myalgias.  Skin: Negative for rash.  Neurological: Negative for headaches.       +MS  Psychiatric/Behavioral: Negative for depression. The patient has insomnia.    Past Medical History:  Diagnosis Date  . Basal cell carcinoma    forehead Dr. Evorn Gong in 2019   . Kidney stones    age 72  . Multiple sclerosis (HCC)    mild sxs in hand and left eye Dr. Kerman Passey neurology  .  Vision abnormalities    Past Surgical History:  Procedure Laterality Date  . CYSTOSCOPY WITH STENT PLACEMENT    . LITHOTRIPSY     age 46 y.o   . VASECTOMY     Family History  Problem Relation Age of Onset  . Hypertension Mother   . Hypertension Father    Social History   Socioeconomic History  . Marital status: Married    Spouse name: Not on file  . Number of children: Not on file  . Years of education: Not on file  . Highest education level: Not on file  Occupational History  . Not on file  Tobacco Use  . Smoking status: Never Smoker  . Smokeless tobacco: Never Used  Substance and Sexual Activity  . Alcohol use: Yes    Comment: "maybe once a month"  . Drug use: Not Currently  . Sexual activity: Yes    Partners: Female  Other Topics Concern  . 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  Difficulty of Paying Living Expenses:   Food Insecurity:   . Worried About Charity fundraiser in the Last Year:   . Arboriculturist in the Last Year:   Transportation Needs:   . Film/video editor (Medical):   [name] Lack of Transportation (Non-Medical):   Physical Activity:   . Days of Exercise per Week:   . Minutes of Exercise per Session:   Stress:   . Feeling of Stress :   Social Connections:   . Frequency of Communication with Friends and Family:   . Frequency of Social Gatherings with Friends and Family:   . Attends Religious Services:   . Active Member of Clubs or Organizations:   . Attends Archivist Meetings:   [name] Marital Status:   Intimate Partner Violence:   . Fear of Current or Ex-Partner:   . Emotionally Abused:   [name] Physically Abused:   . Sexually Abused:     Current Meds  Medication Sig  . cholecalciferol (VITAMIN D) 1000 UNITS tablet Take 1,000 Units by mouth daily.  . VUMERITY 231 MG CPDR Take 2 capsules by mouth twice daily   No Known Allergies Recent Results (from the past 2160 hour(s))  Testosterone     Status: None   Collection Time: 12/01/19  8:05 AM  Result Value Ref Range   Testosterone 422.05 300.00 - 890.00 ng/dL  PSA     Status: None   Collection Time: 12/01/19  8:05 AM  Result Value Ref Range   PSA 0.83 0.10 - 4.00 ng/mL    Comment: Test performed using Access [employer]br[employer]ch PSA Assay, a parmagnetic partical, chemiluminecent immunoassay.  Vitamin D (25 hydroxy)     Status: None   Collection Time: 12/01/19  8:05 AM  Result Value Ref Range   VITD 43.41 30.00 - 100.00 ng/mL  Urinalysis, Routine w reflex microscopic     Status: Abnormal   Collection Time: 12/01/19  8:05 AM  Result Value Ref Range   Color, Urine YELLOW YELLOW   APPearance CLOUDY (A) CLEAR   Specific Gravity, Urine 1.020 1.001 - 1.03   pH < OR = 5.0 5.0 - 8.0   Glucose, UA NEGATIVE NEGATIVE   Bilirubin Urine NEGATIVE NEGATIVE   Ketones, ur NEGATIVE NEGATIVE   Hgb urine dipstick NEGATIVE NEGATIVE   Protein, ur NEGATIVE NEGATIVE   Nitrite NEGATIVE NEGATIVE   Leukocytes,Ua NEGATIVE NEGATIVE  TSH     Status: None   Collection Time: 12/01/19  8:05 AM  Result Value Ref Range   TSH 1.93 0.35 - 4.50 uIU/mL  CBC w/Diff     Status: None   Collection Time: 12/01/19  8:05 AM  Result Value Ref Range   WBC 8.6 4.0 - 10.5 K/uL   RBC 5.31 4.22 - 5.81 Mil/uL   Hemoglobin 15.2 13.0 - 17.0 g/dL   HCT 44.5 39.0 - 52.0 %   MCV 83.8 78.0 - 100.0 fl   MCHC 34.1 30.0 - 36.0 g/dL   RDW 12.8 11.5 - 15.5 %   Platelets 190.0 150.0 - 400.0 K/uL   Neutrophils Relative % 61.9 43.0 - 77.0 %   Lymphocytes Relative 28.9 12.0 - 46.0 %   Monocytes Relative 5.9 3.0 - 12.0 %   Eosinophils Relative 2.4 0.0 - 5.0 %   Basophils Relative 0.9 0.0 - 3.0 %   Neutro Abs 5.3 1.4 - 7.7 K/uL    Lymphs Abs 2.5 0.7 - 4.0 K/uL   Monocytes Absolute 0.5 0.1 -  1.0 K/uL   Eosinophils Absolute 0.2 0.0 - 0.7 K/uL   Basophils Absolute 0.1 0.0 - 0.1 K/uL  Lipid panel     Status: Abnormal   Collection Time: 12/01/19  8:05 AM  Result Value Ref Range   Cholesterol 258 (H) 0 - 200 mg/dL    Comment: ATP III Classification       Desirable:  < 200 mg/dL               Borderline High:  200 - 239 mg/dL          High:  > = 240 mg/dL   Triglycerides 143.0 0.0 - 149.0 mg/dL    Comment: Normal:  <150 mg/dLBorderline High:  150 - 199 mg/dL   HDL 45.00 >39.00 mg/dL   VLDL 28.6 0.0 - 40.0 mg/dL   LDL Cholesterol 184 (H) 0 - 99 mg/dL   Total CHOL/HDL Ratio 6     Comment:                Men          Women1/2 Average Risk     3.4          3.3Average Risk          5.0          4.42X Average Risk          9.6          7.13X Average Risk          15.0          11.0                       NonHDL 212.70     Comment: NOTE:  Non-HDL goal should be 30 mg/dL higher than patient's LDL goal (i.e. LDL goal of < 70 mg/dL, would have non-HDL goal of < 100 mg/dL)  Comprehensive metabolic panel     Status: None   Collection Time: 12/01/19  8:05 AM  Result Value Ref Range   Sodium 139 135 - 145 mEq/L   Potassium 4.2 3.5 - 5.1 mEq/L   Chloride 106 96 - 112 mEq/L   CO2 26 19 - 32 mEq/L   Glucose, Bld 96 70 - 99 mg/dL   BUN 14 6 - 23 mg/dL   Creatinine, Ser 1.07 0.40 - 1.50 mg/dL   Total Bilirubin 0.5 0.2 - 1.2 mg/dL   Alkaline Phosphatase 84 39 - 117 U/L   AST 17 0 - 37 U/L   ALT 27 0 - 53 U/L   Total Protein 7.3 6.0 - 8.3 g/dL   Albumin 4.5 3.5 - 5.2 g/dL   GFR 71.81 >60.00 mL/min   Calcium 9.4 8.4 - 10.5 mg/dL   Objective  Body mass index is 30.91 kg/m. Wt Readings from Last 3 Encounters:  12/03/19 221 lb 9.6 oz (100.5 kg)  08/26/19 215 lb (97.5 kg)  04/14/19 217 lb (98.4 kg)   Temp Readings from Last 3 Encounters:  12/03/19 (!) 97.3 F (36.3 C) (Temporal)  04/14/19 98.2 F (36.8 C)   BP Readings  from Last 3 Encounters:  12/03/19 130/82  04/14/19 (!) 141/89  08/15/18 125/85   Pulse Readings from Last 3 Encounters:  12/03/19 88  04/14/19 (!) 110  08/15/18 73    Physical Exam Vitals and nursing note reviewed.  Constitutional:      Appearance: Normal appearance. He is well-developed and well-groomed.  HENT:     Head:  Normocephalic and atraumatic.  Eyes:     Conjunctiva/sclera: Conjunctivae normal.     Pupils: Pupils are equal, round, and reactive to light.  Cardiovascular:     Rate and Rhythm: Normal rate and regular rhythm.     Heart sounds: Normal heart sounds. No murmur.  Pulmonary:     Effort: Pulmonary effort is normal.     Breath sounds: Normal breath sounds.  Musculoskeletal:       Arms:  Skin:    General: Skin is warm and moist.  Neurological:     General: No focal deficit present.     Mental Status: He is alert and oriented to person, place, and time. Mental status is at baseline.     Gait: Gait normal.  Psychiatric:        Attention and Perception: Attention and perception normal.        Mood and Affect: Mood and affect normal.        Speech: Speech normal.        Behavior: Behavior normal. Behavior is cooperative.        Thought Content: Thought content normal.        Cognition and Memory: Cognition normal.        Judgment: Judgment normal.     Assessment  Plan  Tick bite of abdomen, sequela right lower abdomen - Plan: doxycycline (VIBRA-TABS) 100 MG tablet bid x 10 days  Could be tick bite granuloma if doesn't resolve f/u Dr. Evorn Gong  Lateral epicondylitis of right elbow - Plan: meloxicam (MOBIC) 7.5 MG tablet, Ambulatory referral to Orthopedic Surgery Dr. Posey Pronto voltaren gel,tylenol  Hyperlipidemia, unspecified hyperlipidemia type - Plan: atorvastatin (LIPITOR) 10 MG tablet, CT CARDIAC SCORING  Familial hypercholesteremia - Plan: CT CARDIAC SCORING  Snoring Monitor for now consider sleep study declines for now   Insomnia, unspecified  type otc tranquil sleep  Disc trazadone/lunesta/restoril as well   Acute pain of right shoulder - Plan: Ambulatory referral to Orthopedic Surgery  Right wrist pain - Plan: Ambulatory referral to Orthopedic Surgery  Rib pain on left side - Plan: Ambulatory referral to Orthopedic Surgery   MS controlled on meds  F/u neurology   HM Flu shot never had  tdap rx sent to pharmacy  Disc shingrix x 2 doses consider in future covid vx 2/2 itd  Colonoscopy disc today disc [facility] Dr Cristina Gong pt to call back when ready for referral  Labs reviewed  Normal PSA declines had urology Dr. Farrel Gordon established   Skin Dr. Evorn Gong h/o [facility]    Provider: Dr. Olivia Mackie McLean-Scocuzza-Internal Medicine

## 2019-12-03 NOTE — Patient Instructions (Addendum)
Dr. Annette Stable GI colonoscopy   Stress relax brand tranquil sleep 1-2 tablets  Trazadone or Lunesta or Restoril prescription meds   voltaren gel  Petra Kuba wise or nature made tumeric/ginger/curcumin   Dentist discuss about snoring    Atorvastatin tablets What is this medicine? ATORVASTATIN (a TORE va sta tin) is known as a HMG-CoA reductase inhibitor or 'statin'. It lowers the level of cholesterol and triglycerides in the blood. This drug may also reduce the risk of heart attack, stroke, or other health problems in patients with risk factors for heart disease. Diet and lifestyle changes are often used with this drug. This medicine may be used for other purposes; ask your health care provider or pharmacist if you have questions. COMMON BRAND NAME(S): Lipitor What should I tell my health care provider before I take this medicine? They need to know if you have any of these conditions:  diabetes  if you often drink alcohol  history of stroke  kidney disease  liver disease  muscle aches or weakness  thyroid disease  an unusual or allergic reaction to atorvastatin, other medicines, foods, dyes, or preservatives  pregnant or trying to get pregnant  breast-feeding How should I use this medicine? Take this medicine by mouth with a glass of water. Follow the directions on the prescription label. You can take it with or without food. If it upsets your stomach, take it with food. Do not take with grapefruit juice. Take your medicine at regular intervals. Do not take it more often than directed. Do not stop taking except on your doctor's advice. Talk to your pediatrician regarding the use of this medicine in children. While this drug may be prescribed for children as young as 10 for selected conditions, precautions do apply. Overdosage: If you think you have taken too much of this medicine contact a poison control center or emergency room at once. NOTE: This medicine is only for you.  Do not share this medicine with others. What if I miss a dose? If you miss a dose, take it as soon as you can. If your next dose is to be taken in less than 12 hours, then do not take the missed dose. Take the next dose at your regular time. Do not take double or extra doses. What may interact with this medicine? Do not take this medicine with any of the following medications:  dasabuvir; ombitasvir; paritaprevir; ritonavir  ombitasvir; paritaprevir; ritonavir  posaconazole  red yeast rice This medicine may also interact with the following medications:  alcohol  birth control pills  certain antibiotics like erythromycin and clarithromycin  certain antivirals for HIV or hepatitis  certain medicines for cholesterol like fenofibrate, gemfibrozil, and niacin  certain medicines for fungal infections like ketoconazole and itraconazole  colchicine  cyclosporine  digoxin  grapefruit juice  rifampin This list may not describe all possible interactions. Give your health care provider a list of all the medicines, herbs, non-prescription drugs, or dietary supplements you use. Also tell them if you smoke, drink alcohol, or use illegal drugs. Some items may interact with your medicine. What should I watch for while using this medicine? Visit your doctor or health care professional for regular check-ups. You may need regular tests to make sure your liver is working properly. Your health care professional may tell you to stop taking this medicine if you develop muscle problems. If your muscle problems do not go away after stopping this medicine, contact your health care professional. Do not become pregnant  while taking this medicine. Women should inform their health care professional if they wish to become pregnant or think they might be pregnant. There is a potential for serious side effects to an unborn child. Talk to your health care professional or pharmacist for more information. Do not  breast-feed an infant while taking this medicine. This medicine may increase blood sugar. Ask your healthcare provider if changes in diet or medicines are needed if you have diabetes. If you are going to need surgery or other procedure, tell your doctor that you are using this medicine. This drug is only part of a total heart-health program. Your doctor or a dietician can suggest a low-cholesterol and low-fat diet to help. Avoid alcohol and smoking, and keep a proper exercise schedule. This medicine may cause a decrease in Co-Enzyme Q-10. You should make sure that you get enough Co-Enzyme Q-10 while you are taking this medicine. Discuss the foods you eat and the vitamins you take with your health care professional. What side effects may I notice from receiving this medicine? Side effects that you should report to your doctor or health care professional as soon as possible:  allergic reactions like skin rash, itching or hives, swelling of the face, lips, or tongue  fever  joint pain  loss of memory  redness, blistering, peeling or loosening of the skin, including inside the mouth  signs and symptoms of high blood sugar such as being more thirsty or hungry or having to urinate more than normal. You may also feel very tired or have blurry vision.  signs and symptoms of liver injury like dark yellow or brown urine; general ill feeling or flu-like symptoms; light-belly pain; unusually weak or tired; yellowing of the eyes or skin  signs and symptoms of muscle injury like dark urine; trouble passing urine or change in the amount of urine; unusually weak or tired; muscle pain or side or back pain Side effects that usually do not require medical attention (report to your doctor or health care professional if they continue or are bothersome):  diarrhea  nausea  stomach pain  trouble sleeping  upset stomach This list may not describe all possible side effects. Call your doctor for medical  advice about side effects. You may report side effects to FDA at 1-800-FDA-1088. Where should I keep my medicine? Keep out of the reach of children. Store between 20 and 25 degrees C (68 and 77 degrees F). Throw away any unused medicine after the expiration date. NOTE: This sheet is a summary. It may not cover all possible information. If you have questions about this medicine, talk to your doctor, pharmacist, or health care provider.  2020 Elsevier/Gold Standard (2018-05-07 11:36:16)    Wrist and Forearm Exercises Ask your health care provider which exercises are safe for you. Do exercises exactly as told by your health care provider and adjust them as directed. It is normal to feel mild stretching, pulling, tightness, or discomfort as you do these exercises. Stop right away if you feel sudden pain or your pain gets worse. Do not begin these exercises until told by your health care provider. Range-of-motion exercises These exercises warm up your muscles and joints and improve the movement and flexibility of your injured wrist and forearm. These exercises also help to relieve pain, numbness, and tingling. These exercises are done using the muscles in your injured wrist and forearm. Wrist flexion 1. Bend your left / right elbow to a 90-degree angle (right angle) with your palm  facing the floor. 2. Bend your wrist so that your fingers point toward the floor (flexion). 3. Hold this position for __________ seconds. 4. Slowly return to the starting position. Repeat __________ times. Complete this exercise __________ times a day. Wrist extension 1. Bend your left / right elbow to a 90-degree angle (right angle) with your palm facing the floor. 2. Bend your wrist so that your fingers point toward the ceiling (extension). 3. Hold this position for __________ seconds. 4. Slowly return to the starting position. Repeat __________ times. Complete this exercise __________ times a day. Ulnar  deviation 1. Bend your left / right elbow to a 90-degree angle (right angle), and rest your forearm on a table with your palm facing down. 2. Keeping your hand flat on the table, bend your left /right wrist toward your small finger (pinkie). This is ulnar deviation. 3. Hold this position for __________ seconds. 4. Slowly return to the starting position. Repeat __________ times. Complete this exercise __________ times a day. Radial deviation 1. Bend your left / right elbow to a 90-degree angle (right angle), and rest your forearm on a table with your palm facing down. 2. Keeping your hand flat on the table, bend your left /right wrist toward your thumb. This is radial deviation. 3. Hold this position for __________ seconds. 4. Slowly return to the starting position. Repeat __________ times. Complete this exercise __________ times a day. Forearm rotation, supination  1. Sit with your left / right elbow bent to a 90-degree angle (right angle). Position your forearm so that the thumb is facing the ceiling (neutral position). 2. Turn (rotate) your palm up toward the ceiling (supination), stopping when you feel a gentle stretch. 3. Hold this position for __________ seconds. 4. Slowly return to the starting position. Repeat __________ times. Complete this exercise __________ times a day. Forearm rotation, pronation  1. Sit with your left / right elbow bent to a 90-degree angle (right angle). Position your forearm so that the thumb is facing the ceiling (neutral position). 2. Rotate your palm down toward the floor (pronation), stopping when you feel a gentle stretch. 3. Hold this position for __________ seconds. 4. Slowly return to the starting position. Repeat __________ times. Complete this exercise __________ times a day. Stretching These exercises warm up your muscles and joints and improve the movement and flexibility of your injured wrist and forearm. These exercises also help to relieve  pain, numbness, and tingling. These exercises are done using your healthy wrist and forearm to help stretch the muscles in your injured wrist and forearm. Wrist flexion  1. Extend your left / right arm in front of you, and turn your palm down toward the floor. ? If told by your health care provider, bend your left / right elbow to a 90-degree angle (right angle) at your side. 2. Using your uninjured hand, gently press over the back of your left / right hand to bend your wrist and fingers toward the floor (flexion). Go as far as you can to feel a stretch without causing pain. 3. Hold this position for __________ seconds. 4. Slowly return to the starting position. Repeat __________ times. Complete this exercise __________ times a day. Wrist extension  1. Extend your left / right arm in front of you and turn your palm up toward the ceiling. ? If told by your health care provider, bend your left / right elbow to a 90-degree angle (right angle) at your side. 2. Using your uninjured hand, gently  press over the palm of your left / right hand to bend your wrist and fingers toward the floor (extension). Go as far as you can to feel a stretch without causing pain. 3. Hold this position for __________ seconds. 4. Slowly return to the starting position. Repeat __________ times. Complete this exercise __________ times a day. Forearm rotation, supination 1. Sit with your left / right elbow bent to a 90-degree angle (right angle). Position your forearm so that the thumb is facing the ceiling (neutral position). 2. Rotate your palm up toward the ceiling as far as you can on your own (supination). Then, use your uninjured hand to help turn your forearm more, stopping when you feel a gentle stretch. 3. Hold this position for __________ seconds. 4. Slowly return to the starting position. Repeat __________ times. Complete this exercise __________ times a day. Forearm rotation, pronation 1. Sit with your left /  right elbow bent to a 90-degree angle (right angle). Position your forearm so that the thumb is facing the ceiling (neutral position). 2. Rotate your palm down toward the floor as far as you can on your own (pronation). Then, use your uninjured hand to help turn your forearm more, stopping when you feel a gentle stretch. 3. Hold this position for __________ seconds. 4. Slowly return to the starting position. Repeat __________ times. Complete this exercise __________ times a day. Strengthening exercises These exercises build strength and endurance in your wrist and forearm. Endurance is the ability to use your muscles for a long time, even after they get tired. Wrist flexion  1. Sit with your left / right forearm supported on a table or other surface. Bend your elbow to a 90-degree angle (right angle), and rest your hand palm-up over the edge of the table. 2. Hold a __________ weight in your left / right hand. Or, hold an exercise band or tube in both hands, keeping your hands at the same level and hip distance apart. There should be a slight tension in the exercise band or tube. 3. Slowly curl your hand up toward the ceiling (flexion). 4. Hold this position for __________ seconds. 5. Slowly lower your hand back to the starting position. Repeat __________ times. Complete this exercise __________ times a day. Wrist extension  1. Sit with your left / right forearm supported on a table or other surface. Bend your elbow to a 90-degree angle (right angle), and rest your hand palm-down over the edge of the table. 2. Hold a __________ weight in your left / right hand. Or, hold an exercise band or tube in both hands, keeping your hands at the same level and hip distance apart. There should be a slight tension in the exercise band or tube. 3. Slowly curl your hand up toward the ceiling (extension). 4. Hold this position for __________ seconds. 5. Slowly lower your hand back to the starting  position. Repeat __________ times. Complete this exercise __________ times a day. Forearm rotation, supination  1. Sit with your left / right forearm supported on a table or other surface. Bend your elbow to a 90-degree angle (right angle). Position your forearm so that your thumb is facing the ceiling (neutral position) and your hand is resting over the edge of the table. 2. Hold a hammer in your left / right hand. ? This exercise will be easier if you hold the hammer near the head of the hammer. ? This exercise will be harder if you hold the hammer near the end  of the handle. 3. Without moving your elbow, slowly rotate your palm up toward the ceiling (supination). 4. Hold this position for __________ seconds. 5. Slowly return to the starting position. Repeat __________ times. Complete this exercise __________ times a day. Forearm rotation, pronation  1. Sit with your left / right forearm supported on a table or other surface. Bend your elbow to a 90-degree angle (right angle). Position your forearm so that the thumb is facing the ceiling (neutral position), with your hand resting over the edge of the table. 2. Hold a hammer in your left / right hand. ? This exercise will be easier if you hold the hammer near the head of the hammer. ? This exercise will be harder if you hold the hammer near the end of the handle. 3. Without moving your elbow, slowly rotate your palm down toward the floor (pronation). 4. Hold this position for __________ seconds. 5. Slowly return to the starting position. Repeat __________ times. Complete this exercise __________ times a day. Grip strengthening  1. Grasp a stress ball or other ball in the middle of your left / right hand. Start with your elbow bent to a 90-degree angle (right angle). 2. Slowly increase the pressure, squeezing the ball as hard as you can without causing pain. ? Think of bringing the tips of your fingers into the middle of your palm. All of  your finger joints should bend when doing this exercise. ? To make this exercise harder, gradually try to straighten your elbow in front of you, until you can do the exercise with your elbow fully straight. 3. Hold your squeeze for __________ seconds, then relax. If instructed by your health care provider, do this exercise: ? With your forearm positioned so that the thumb is facing the ceiling (neutral position). ? With your forearm turned palm down. ? With your forearm turned palm up. Repeat __________ times. Complete this exercise __________ times a day. This information is not intended to replace advice given to you by your health care provider. Make sure you discuss any questions you have with your health care provider. Document Revised: 09/04/2018 Document Reviewed: 09/04/2018 Elsevier Patient Education  [place] Ask your health care provider which exercises are safe for you. Do exercises exactly as told by your health care provider and adjust them as directed. It is normal to feel mild stretching, pulling, tightness, or discomfort as you do these exercises. Stop right away if you feel sudden pain or your pain gets worse. Do not begin these exercises until told by your health care provider. Stretching and range-of-motion exercises These exercises warm up your muscles and joints and improve the movement and flexibility of your elbow. These exercises also help to relieve pain, numbness, and tingling. Wrist flexion, assisted  5. Straighten your left / right elbow in front of you with your palm facing down toward the floor. ? If told by your health care provider, bend your left / right elbow to a 90-degree angle (right angle) at your side. 6. With your other hand, gently push over the back of your left / right hand so your fingers point toward the floor (flexion). Stop when you feel a gentle stretch on the back of your forearm. 7. Hold this position for __________  seconds. Repeat __________ times. Complete this exercise __________ times a day. Wrist extension, assisted  5. Straighten your left / right elbow in front of you with your palm facing up toward the  ceiling. ? If told by your health care provider, bend your left / right elbow to a 90-degree angle (right angle) at your side. 6. With your other hand, gently pull your left / right hand and fingers toward the floor (extension). Stop when you feel a gentle stretch on the palm side of your forearm. 7. Hold this position for __________ seconds. Repeat __________ times. Complete this exercise __________ times a day. Assisted forearm rotation, supination 5. Sit or stand with your left / right elbow bent to a 90-degree angle (right angle) at your side. 6. Using your uninjured hand, turn (rotate) your left / right palm up toward the ceiling (supination) until you feel a gentle stretch along the inside of your forearm. 7. Hold this position for __________ seconds. Repeat __________ times. Complete this exercise __________ times a day. Assisted forearm rotation, pronation 5. Sit or stand with your left / right elbow bent to a 90-degree angle (right angle) at your side. 6. Using your uninjured hand, rotate your left / right palm down toward the floor (pronation) until you feel a gentle stretch along the outside of your forearm. 7. Hold this position for __________ seconds. Repeat __________ times. Complete this exercise __________ times a day. Strengthening exercises These exercises build strength and endurance in your forearm and elbow. Endurance is the ability to use your muscles for a long time, even after they get tired. Radial deviation  5. Stand with a __________ weight or a hammer in your left / right hand. Or, sit while holding a rubber exercise band or tubing, with your left / right forearm supported on a table or countertop. ? If you are standing, position your forearm so that your thumb is facing  forward. If you are sitting, position your forearm so that the thumb is facing the ceiling. This is the neutral position. 6. Raise your hand upward in front of you so your thumb moves toward the ceiling (radial deviation), or pull up on the rubber tubing. Keep your forearm and elbow still while you move your wrist only. 7. Hold this position for __________ seconds. 8. Slowly return to the starting position. Repeat __________ times. Complete this exercise __________ times a day. Wrist extension, eccentric 5. Sit with your left / right forearm palm-down and supported on a table or other surface. Let your left / right wrist extend over the edge of the surface. 6. Hold a __________ weight or a piece of exercise band or tubing in your left / right hand. ? If using a rubber exercise band or tubing, hold the other end of the tubing with your other hand. 7. Use your uninjured hand to move your left / right hand up toward the ceiling. 8. Take your uninjured hand away and slowly return to the starting position using only your left / right hand. Lowering your arm under tension is called eccentric extension. Repeat __________ times. Complete this exercise __________ times a day. Wrist extension Do not do this exercise if it causes pain at the outside of your elbow. Only do this exercise once instructed by your health care provider. 5. Sit with your left / right forearm supported on a table or other surface and your palm turned down toward the floor. Let your left / right wrist extend over the edge of the surface. 6. Hold a __________ weight or a piece of rubber exercise band or tubing. ? If you are using a rubber exercise band or tubing, hold the band or tubing  in place with your other hand to provide resistance. 7. Slowly bend your wrist so your hand moves up toward the ceiling (extension). Move only your wrist, keeping your forearm and elbow still. 8. Hold this position for __________ seconds. 9. Slowly  return to the starting position. Repeat __________ times. Complete this exercise __________ times a day. Forearm rotation, supination To do this exercise, you will need a lightweight hammer or rubber mallet. 5. Sit with your left / right forearm supported on a table or other surface. Bend your elbow to a 90-degree angle (right angle). Position your forearm so that your palm is facing down toward the floor, with your hand resting over the edge of the table. 6. Hold a hammer in your left / right hand. ? To make this exercise easier, hold the hammer near the head of the hammer. ? To make this exercise harder, hold the hammer near the end of the handle. 7. Without moving your wrist or elbow, slowly rotate your forearm so your palm faces up toward the ceiling (supination). 8. Hold this position for __________ seconds. 9. Slowly return to the starting position. Repeat __________ times. Complete this exercise __________ times a day. Shoulder blade squeeze 5. Sit in a stable chair or stand with good posture. If you are sitting down, do not let your back touch the back of the chair. 6. Your arms should be at your sides with your elbows bent to a 90-degree angle (right angle). Position your forearms so that your thumbs are facing the ceiling (neutral position). 7. Without lifting your shoulders up, squeeze your shoulder blades tightly together. 8. Hold this position for __________ seconds. 9. Slowly release and return to the starting position. Repeat __________ times. Complete this exercise __________ times a day. This information is not intended to replace advice given to you by your health care provider. Make sure you discuss any questions you have with your health care provider. Document Revised: 11/06/2018 Document Reviewed: 09/09/2018 Elsevier Patient Education  [place].   Insomnia Insomnia is a sleep disorder that makes it difficult to fall asleep or stay asleep. Insomnia can cause  fatigue, low energy, difficulty concentrating, mood swings, and poor performance at work or school. There are three different ways to classify insomnia:  Difficulty falling asleep.  Difficulty staying asleep.  Waking up too early in the morning. Any type of insomnia can be long-term (chronic) or short-term (acute). Both are common. Short-term insomnia usually lasts for three months or less. Chronic insomnia occurs at least three times a week for longer than three months. What are the causes? Insomnia may be caused by another condition, situation, or substance, such as:  Anxiety.  Certain medicines.  Gastroesophageal reflux disease (GERD) or other gastrointestinal conditions.  Asthma or other breathing conditions.  Restless legs syndrome, sleep apnea, or other sleep disorders.  Chronic pain.  Menopause.  Stroke.  Abuse of alcohol, tobacco, or illegal drugs.  Mental health conditions, such as depression.  Caffeine.  Neurological disorders, such as Alzheimer's disease.  An overactive thyroid (hyperthyroidism). Sometimes, the cause of insomnia may not be known. What increases the risk? Risk factors for insomnia include:  Gender. Women are affected more often than men.  Age. Insomnia is more common as you get older.  Stress.  Lack of exercise.  Irregular work schedule or working night shifts.  Traveling between different time zones.  Certain medical and mental health conditions. What are the signs or symptoms? If you have insomnia, the  main symptom is having trouble falling asleep or having trouble staying asleep. This may lead to other symptoms, such as:  Feeling fatigued or having low energy.  Feeling nervous about going to sleep.  Not feeling rested in the morning.  Having trouble concentrating.  Feeling irritable, anxious, or depressed. How is this diagnosed? This condition may be diagnosed based on:  Your symptoms and medical history. Your health  care provider may ask about: ? Your sleep habits. ? Any medical conditions you have. ? Your mental health.  A physical exam. How is this treated? Treatment for insomnia depends on the cause. Treatment may focus on treating an underlying condition that is causing insomnia. Treatment may also include:  Medicines to help you sleep.  Counseling or therapy.  Lifestyle adjustments to help you sleep better. Follow these instructions at home: Eating and drinking   Limit or avoid alcohol, caffeinated beverages, and cigarettes, especially close to bedtime. These can disrupt your sleep.  Do not eat a large meal or eat spicy foods right before bedtime. This can lead to digestive discomfort that can make it hard for you to sleep. Sleep habits   Keep a sleep diary to help you and your health care provider figure out what could be causing your insomnia. Write down: ? When you sleep. ? When you wake up during the night. ? How well you sleep. ? How rested you feel the next day. ? Any side effects of medicines you are taking. ? What you eat and drink.  Make your bedroom a dark, comfortable place where it is easy to fall asleep. ? Put up shades or blackout curtains to block light from outside. ? Use a white noise machine to block noise. ? Keep the temperature cool.  Limit screen use before bedtime. This includes: ? Watching TV. ? Using your smartphone, tablet, or computer.  Stick to a routine that includes going to bed and waking up at the same times every day and night. This can help you fall asleep faster. Consider making a quiet activity, such as reading, part of your nighttime routine.  Try to avoid taking naps during the day so that you sleep better at night.  Get out of bed if you are still awake after 15 minutes of trying to sleep. Keep the lights down, but try reading or doing a quiet activity. When you feel sleepy, go back to bed. General instructions  Take over-the-counter and  prescription medicines only as told by your health care provider.  Exercise regularly, as told by your health care provider. Avoid exercise starting several hours before bedtime.  Use relaxation techniques to manage stress. Ask your health care provider to suggest some techniques that may work well for you. These may include: ? Breathing exercises. ? Routines to release muscle tension. ? Visualizing peaceful scenes.  Make sure that you drive carefully. Avoid driving if you feel very sleepy.  Keep all follow-up visits as told by your health care provider. This is important. Contact a health care provider if:  You are tired throughout the day.  You have trouble in your daily routine due to sleepiness.  You continue to have sleep problems, or your sleep problems get worse. Get help right away if:  You have serious thoughts about hurting yourself or someone else. If you ever feel like you may hurt yourself or others, or have thoughts about taking your own life, get help right away. You can go to your nearest emergency department or  call:  Your local emergency services (911 in the [place]  A suicide crisis helpline, such as [facility] at 819-675-2019. This is open 24 hours a day. Summary  Insomnia is a sleep disorder that makes it difficult to fall asleep or stay asleep.  Insomnia can be long-term (chronic) or short-term (acute).  Treatment for insomnia depends on the cause. Treatment may focus on treating an underlying condition that is causing insomnia.  Keep a sleep diary to help you and your health care provider figure out what could be causing your insomnia. This information is not intended to replace advice given to you by your health care provider. Make sure you discuss any questions you have with your health care provider. Document Revised: 06/28/2017 Document Reviewed: 04/25/2017 Elsevier Patient Education  2020 [employer].  Colonoscopy,  Adult A colonoscopy is a procedure to look at the entire large intestine. This procedure is done using a long, thin, flexible tube that has a camera on the end. You may have a colonoscopy:  As a part of normal colorectal screening.  If you have certain symptoms, such as: ? A low number of red blood cells in your blood (anemia). ? Diarrhea that does not go away. ? Pain in your abdomen. ? Blood in your stool. A colonoscopy can help screen for and diagnose medical problems, including:  Tumors.  Extra tissue that grows where mucus forms (polyps).  Inflammation.  Areas of bleeding. Tell your health care provider about:  Any allergies you have.  All medicines you are taking, including vitamins, herbs, eye drops, creams, and over-the-counter medicines.  Any problems you or family members have had with anesthetic medicines.  Any blood disorders you have.  Any surgeries you have had.  Any medical conditions you have.  Any problems you have had with having bowel movements.  Whether you are pregnant or may be pregnant. What are the risks? Generally, this is a safe procedure. However, problems may occur, including:  Bleeding.  Damage to your intestine.  Allergic reactions to medicines given during the procedure.  Infection. This is rare. What happens before the procedure? Eating and drinking restrictions Follow instructions from your health care provider about eating or drinking restrictions, which may include:  A few days before the procedure: ? Follow a low-fiber diet. ? Avoid nuts, seeds, dried fruit, raw fruits, and vegetables.  1-3 days before the procedure: ? Eat only gelatin dessert or ice pops. ? Drink only clear liquids, such as water, clear juice, clear broth or bouillon, black coffee or tea, or clear soft drinks or sports drinks. ? Avoid liquids that contain red or purple dye.  The day of the procedure: ? Do not eat solid foods. You may continue to drink  clear liquids until up to 2 hours before the procedure. ? Do not eat or drink anything starting 2 hours before the procedure, or within the time period that your health care provider recommends. Bowel prep If you were prescribed a bowel prep to take by mouth (orally) to clean out your colon:  Take it as told by your health care provider. Starting the day before your procedure, you will need to drink a large amount of liquid medicine. The liquid will cause you to have many bowel movements of loose stool until your stool becomes almost clear or light green.  If your skin or the opening between the buttocks (anus) gets irritated from diarrhea, you may relieve the irritation using: ? Wipes with  medicine in them, such as adult wet wipes with aloe and vitamin E. ? A product to soothe skin, such as petroleum jelly.  If you vomit while drinking the bowel prep: ? Take a break for up to 60 minutes. ? Begin the bowel prep again. ? Call your health care provider if you keep vomiting or you cannot take the bowel prep without vomiting.  To clean out your colon, you may also be given: ? Laxative medicines. These help you have a bowel movement. ? Instructions for enema use. An enema is liquid medicine injected into your rectum. Medicines Ask your health care provider about:  Changing or stopping your regular medicines or supplements. This is especially important if you are taking iron supplements, diabetes medicines, or blood thinners.  Taking medicines such as aspirin and ibuprofen. These medicines can thin your blood. Do not take these medicines unless your health care provider tells you to take them.  Taking over-the-counter medicines, vitamins, herbs, and supplements. General instructions  Ask your health care provider what steps will be taken to help prevent infection. These may include washing skin with a germ-killing soap.  Plan to have someone take you home from the hospital or clinic. What  happens during the procedure?   An IV will be inserted into one of your veins.  You may be given one or more of the following: ? A medicine to help you relax (sedative). ? A medicine to numb the area (local anesthetic). ? A medicine to make you fall asleep (general anesthetic). This is rarely needed.  You will lie on your side with your knees bent.  The tube will: ? Have oil or gel put on it (be lubricated). ? Be inserted into your anus. ? Be gently eased through all parts of your large intestine.  Air will be sent into your colon to keep it open. This may cause some pressure or cramping.  Images will be taken with the camera and will appear on a screen.  A small tissue sample may be removed to be looked at under a microscope (biopsy). The tissue may be sent to a lab for testing if any signs of problems are found.  If small polyps are found, they may be removed and checked for cancer cells.  When the procedure is finished, the tube will be removed. The procedure may vary among health care providers and hospitals. What happens after the procedure?  Your blood pressure, heart rate, breathing rate, and blood oxygen level will be monitored until you leave the hospital or clinic.  You may have a small amount of blood in your stool.  You may pass gas and have mild cramping or bloating in your abdomen. This is caused by the air that was used to open your colon during the exam.  Do not drive for 24 hours after the procedure.  It is up to you to get the results of your procedure. Ask your health care provider, or the department that is doing the procedure, when your results will be ready. Summary  A colonoscopy is a procedure to look at the entire large intestine.  Follow instructions from your health care provider about eating and drinking before the procedure.  If you were prescribed an oral bowel prep to clean out your colon, take it as told by your health care  provider.  During the colonoscopy, a flexible tube with a camera on its end is inserted into the anus and then passed into the other  parts of the large intestine. This information is not intended to replace advice given to you by your health care provider. Make sure you discuss any questions you have with your health care provider. Document Revised: 02/06/2019 Document Reviewed: 02/06/2019 Elsevier Patient Education  [place].  High Cholesterol  High cholesterol is a condition in which the blood has high levels of a white, waxy, fat-like substance (cholesterol). The human body needs small amounts of cholesterol. The liver makes all the cholesterol that the body needs. Extra (excess) cholesterol comes from the food that we eat. Cholesterol is carried from the liver by the blood through the blood vessels. If you have high cholesterol, deposits (plaques) may build up on the walls of your blood vessels (arteries). Plaques make the arteries narrower and stiffer. Cholesterol plaques increase your risk for heart attack and stroke. Work with your health care provider to keep your cholesterol levels in a healthy range. What increases the risk? This condition is more likely to develop in people who:  Eat foods that are high in animal fat (saturated fat) or cholesterol.  Are overweight.  Are not getting enough exercise.  Have a family history of high cholesterol. What are the signs or symptoms? There are no symptoms of this condition. How is this diagnosed? This condition may be diagnosed from the results of a blood test.  If you are older than age 82, your health care provider may check your cholesterol every 4-6 years.  You may be checked more often if you already have high cholesterol or other risk factors for heart disease. The blood test for cholesterol measures:  "Bad" cholesterol (LDL cholesterol). This is the main type of cholesterol that causes heart disease. The desired level  for LDL is less than 100.  "Good" cholesterol (HDL cholesterol). This type helps to protect against heart disease by cleaning the arteries and carrying the LDL away. The desired level for HDL is 60 or higher.  Triglycerides. These are fats that the body can store or burn for energy. The desired number for triglycerides is lower than 150.  Total cholesterol. This is a measure of the total amount of cholesterol in your blood, including LDL cholesterol, HDL cholesterol, and triglycerides. A healthy number is less than 200. How is this treated? This condition is treated with diet changes, lifestyle changes, and medicines. Diet changes  This may include eating more whole grains, fruits, vegetables, nuts, and fish.  This may also include cutting back on red meat and foods that have a lot of added sugar. Lifestyle changes  Changes may include getting at least 40 minutes of aerobic exercise 3 times a week. Aerobic exercises include walking, biking, and swimming. Aerobic exercise along with a healthy diet can help you maintain a healthy weight.  Changes may also include quitting smoking. Medicines  Medicines are usually given if diet and lifestyle changes have failed to reduce your cholesterol to healthy levels.  Your health care provider may prescribe a statin medicine. Statin medicines have been shown to reduce cholesterol, which can reduce the risk of heart disease. Follow these instructions at home: Eating and drinking If told by your health care provider:  Eat chicken (without skin), fish, veal, shellfish, ground [place] breast, and round or loin cuts of red meat.  Do not eat fried foods or fatty meats, such as hot dogs and salami.  Eat plenty of fruits, such as apples.  Eat plenty of vegetables, such as broccoli, potatoes, and carrots.  Eat  beans, peas, and lentils.  Eat grains such as barley, rice, couscous, and bulgur wheat.  Eat pasta without cream sauces.  Use skim or nonfat  milk, and eat low-fat or nonfat yogurt and cheeses.  Do not eat or drink whole milk, cream, ice cream, egg yolks, or hard cheeses.  Do not eat stick margarine or tub margarines that contain trans fats (also called partially hydrogenated oils).  Do not eat saturated tropical oils, such as coconut oil and palm oil.  Do not eat cakes, cookies, crackers, or other baked goods that contain trans fats.  General instructions  Exercise as directed by your health care provider. Increase your activity level with activities such as gardening, walking, and taking the stairs.  Take over-the-counter and prescription medicines only as told by your health care provider.  Do not use any products that contain nicotine or tobacco, such as cigarettes and e-cigarettes. If you need help quitting, ask your health care provider.  Keep all follow-up visits as told by your health care provider. This is important. Contact a health care provider if:  You are struggling to maintain a healthy diet or weight.  You need help to start on an exercise program.  You need help to stop smoking. Get help right away if:  You have chest pain.  You have trouble breathing. This information is not intended to replace advice given to you by your health care provider. Make sure you discuss any questions you have with your health care provider. Document Revised: 07/19/2017 Document Reviewed: 01/14/2016 Elsevier Patient Education  [place].  Cholesterol Content in Foods Cholesterol is a waxy, fat-like substance that helps to carry fat in the blood. The body needs cholesterol in small amounts, but too much cholesterol can cause damage to the arteries and heart. Most people should eat less than 200 milligrams (mg) of cholesterol a day. Foods with cholesterol  Cholesterol is found in animal-based foods, such as meat, seafood, and dairy. Generally, low-fat dairy and lean meats have less cholesterol than full-fat dairy  and fatty meats. The milligrams of cholesterol per serving (mg per serving) of common cholesterol-containing foods are listed below. Meat and other proteins  Egg -- one large whole egg has 186 mg.  Veal shank -- 4 oz has 141 mg.  Lean ground [place] (93% lean) -- 4 oz has 118 mg.  Fat-trimmed lamb loin -- 4 oz has 106 mg.  Lean ground beef (90% lean) -- 4 oz has 100 mg.  Lobster -- 3.5 oz has 90 mg.  Pork loin chops -- 4 oz has 86 mg.  Canned salmon -- 3.5 oz has 83 mg.  Fat-trimmed beef top loin -- 4 oz has 78 mg.  Frankfurter -- 1 frank (3.5 oz) has 77 mg.  Crab -- 3.5 oz has 71 mg.  Roasted chicken without skin, white meat -- 4 oz has 66 mg.  Light bologna -- 2 oz has 45 mg.  Deli-cut [place] -- 2 oz has 31 mg.  Canned tuna -- 3.5 oz has 31 mg.  Berniece Salines -- 1 oz has 29 mg.  Oysters and mussels (raw) -- 3.5 oz has 25 mg.  Mackerel -- 1 oz has 22 mg.  Trout -- 1 oz has 20 mg.  Pork sausage -- 1 link (1 oz) has 17 mg.  Salmon -- 1 oz has 16 mg.  Tilapia -- 1 oz has 14 mg. Dairy  Soft-serve ice cream --  cup (4 oz) has 103 mg.  Whole-milk  yogurt -- 1 cup (8 oz) has 29 mg.  Cheddar cheese -- 1 oz has 28 mg.  American cheese -- 1 oz has 28 mg.  Whole milk -- 1 cup (8 oz) has 23 mg.  2% milk -- 1 cup (8 oz) has 18 mg.  Cream cheese -- 1 tablespoon (Tbsp) has 15 mg.  Cottage cheese --  cup (4 oz) has 14 mg.  Low-fat (1%) milk -- 1 cup (8 oz) has 10 mg.  Sour cream -- 1 Tbsp has 8.5 mg.  Low-fat yogurt -- 1 cup (8 oz) has 8 mg.  Nonfat Greek yogurt -- 1 cup (8 oz) has 7 mg.  Half-and-half cream -- 1 Tbsp has 5 mg. Fats and oils  Cod liver oil -- 1 tablespoon (Tbsp) has 82 mg.  Butter -- 1 Tbsp has 15 mg.  Lard -- 1 Tbsp has 14 mg.  Bacon grease -- 1 Tbsp has 14 mg.  Mayonnaise -- 1 Tbsp has 5-10 mg.  Margarine -- 1 Tbsp has 3-10 mg. Exact amounts of cholesterol in these foods may vary depending on specific ingredients and brands. Foods  without cholesterol Most plant-based foods do not have cholesterol unless you combine them with a food that has cholesterol. Foods without cholesterol include:  Grains and cereals.  Vegetables.  Fruits.  Vegetable oils, such as olive, canola, and sunflower oil.  Legumes, such as peas, beans, and lentils.  Nuts and seeds.  Egg whites. Summary  The body needs cholesterol in small amounts, but too much cholesterol can cause damage to the arteries and heart.  Most people should eat less than 200 milligrams (mg) of cholesterol a day. This information is not intended to replace advice given to you by your health care provider. Make sure you discuss any questions you have with your health care provider. Document Revised: 06/28/2017 Document Reviewed: 03/12/2017 Elsevier Patient Education  [place] DASH stands for "Dietary Approaches to Stop Hypertension." The DASH eating plan is a healthy eating plan that has been shown to reduce high blood pressure (hypertension). It may also reduce your risk for type 2 diabetes, heart disease, and stroke. The DASH eating plan may also help with weight loss. What are tips for following this plan?  General guidelines  Avoid eating more than 2,300 mg (milligrams) of salt (sodium) a day. If you have hypertension, you may need to reduce your sodium intake to 1,500 mg a day.  Limit alcohol intake to no more than 1 drink a day for nonpregnant women and 2 drinks a day for men. One drink equals 12 oz of beer, 5 oz of wine, or 1 oz of hard liquor.  Work with your health care provider to maintain a healthy body weight or to lose weight. Ask what an ideal weight is for you.  Get at least 30 minutes of exercise that causes your heart to beat faster (aerobic exercise) most days of the week. Activities may include walking, swimming, or biking.  Work with your health care provider or diet and nutrition specialist (dietitian) to  adjust your eating plan to your individual calorie needs. Reading food labels   Check food labels for the amount of sodium per serving. Choose foods with less than 5 percent of the Daily Value of sodium. Generally, foods with less than 300 mg of sodium per serving fit into this eating plan.  To find whole grains, look for the word "whole" as the first word  in the ingredient list. Shopping  Buy products labeled as "low-sodium" or "no salt added."  Buy fresh foods. Avoid canned foods and premade or frozen meals. Cooking  Avoid adding salt when cooking. Use salt-free seasonings or herbs instead of table salt or sea salt. Check with your health care provider or pharmacist before using salt substitutes.  Do not fry foods. Cook foods using healthy methods such as baking, boiling, grilling, and broiling instead.  Cook with heart-healthy oils, such as olive, canola, soybean, or sunflower oil. Meal planning  Eat a balanced diet that includes: ? 5 or more servings of fruits and vegetables each day. At each meal, try to fill half of your plate with fruits and vegetables. ? Up to 6-8 servings of whole grains each day. ? Less than 6 oz of lean meat, poultry, or fish each day. A 3-oz serving of meat is about the same size as a deck of cards. One egg equals 1 oz. ? 2 servings of low-fat dairy each day. ? A serving of nuts, seeds, or beans 5 times each week. ? Heart-healthy fats. Healthy fats called Omega-3 fatty acids are found in foods such as flaxseeds and coldwater fish, like sardines, salmon, and mackerel.  Limit how much you eat of the following: ? Canned or prepackaged foods. ? Food that is high in trans fat, such as fried foods. ? Food that is high in saturated fat, such as fatty meat. ? Sweets, desserts, sugary drinks, and other foods with added sugar. ? Full-fat dairy products.  Do not salt foods before eating.  Try to eat at least 2 vegetarian meals each week.  Eat more home-cooked  food and less restaurant, buffet, and fast food.  When eating at a restaurant, ask that your food be prepared with less salt or no salt, if possible. What foods are recommended? The items listed may not be a complete list. Talk with your dietitian about what dietary choices are best for you. Grains Whole-grain or whole-wheat bread. Whole-grain or whole-wheat pasta. Brown rice. Oatmeal. Quinoa. Bulgur. Whole-grain and low-sodium cereals. Pita bread. Low-fat, low-sodium crackers. Whole-wheat flour tortillas. Vegetables Fresh or frozen vegetables (raw, steamed, roasted, or grilled). Low-sodium or reduced-sodium tomato and vegetable juice. Low-sodium or reduced-sodium tomato sauce and tomato paste. Low-sodium or reduced-sodium canned vegetables. Fruits All fresh, dried, or frozen fruit. Canned fruit in natural juice (without added sugar). Meat and other protein foods Skinless chicken or turkey. Ground chicken or turkey. Pork with fat trimmed off. Fish and seafood. Egg whites. Dried beans, peas, or lentils. Unsalted nuts, nut butters, and seeds. Unsalted canned beans. Lean cuts of beef with fat trimmed off. Low-sodium, lean deli meat. Dairy Low-fat (1%) or fat-free (skim) milk. Fat-free, low-fat, or reduced-fat cheeses. Nonfat, low-sodium ricotta or cottage cheese. Low-fat or nonfat yogurt. Low-fat, low-sodium cheese. Fats and oils Soft margarine without trans fats. Vegetable oil. Low-fat, reduced-fat, or light mayonnaise and salad dressings (reduced-sodium). Canola, safflower, olive, soybean, and sunflower oils. Avocado. Seasoning and other foods Herbs. Spices. Seasoning mixes without salt. Unsalted popcorn and pretzels. Fat-free sweets. What foods are not recommended? The items listed may not be a complete list. Talk with your dietitian about what dietary choices are best for you. Grains Baked goods made with fat, such as croissants, muffins, or some breads. Dry pasta or rice meal  packs. Vegetables Creamed or fried vegetables. Vegetables in a cheese sauce. Regular canned vegetables (not low-sodium or reduced-sodium). Regular canned tomato sauce and paste (not low-sodium or reduced-sodium).   Regular tomato and vegetable juice (not low-sodium or reduced-sodium). Angie Fava. Olives. Fruits Canned fruit in a light or heavy syrup. Fried fruit. Fruit in cream or butter sauce. Meat and other protein foods Fatty cuts of meat. Ribs. Fried meat. Berniece Salines. Sausage. Bologna and other processed lunch meats. Salami. Fatback. Hotdogs. Bratwurst. Salted nuts and seeds. Canned beans with added salt. Canned or smoked fish. Whole eggs or egg yolks. Chicken or [place] with skin. Dairy Whole or 2% milk, cream, and half-and-half. Whole or full-fat cream cheese. Whole-fat or sweetened yogurt. Full-fat cheese. Nondairy creamers. Whipped toppings. Processed cheese and cheese spreads. Fats and oils Butter. Stick margarine. Lard. Shortening. Ghee. Bacon fat. Tropical oils, such as coconut, palm kernel, or palm oil. Seasoning and other foods Salted popcorn and pretzels. Onion salt, garlic salt, seasoned salt, table salt, and sea salt. Worcestershire sauce. Tartar sauce. Barbecue sauce. Teriyaki sauce. Soy sauce, including reduced-sodium. Steak sauce. Canned and packaged gravies. Fish sauce. Oyster sauce. Cocktail sauce. Horseradish that you find on the shelf. Ketchup. Mustard. Meat flavorings and tenderizers. Bouillon cubes. Hot sauce and Tabasco sauce. Premade or packaged marinades. Premade or packaged taco seasonings. Relishes. Regular salad dressings. Where to find more information:  National Heart, Lung, and [place]: https://wilson-eaton.com/  American Heart Association: www.heart.org Summary  The DASH eating plan is a healthy eating plan that has been shown to reduce high blood pressure (hypertension). It may also reduce your risk for type 2 diabetes, heart disease, and stroke.  With the DASH eating  plan, you should limit salt (sodium) intake to 2,300 mg a day. If you have hypertension, you may need to reduce your sodium intake to 1,500 mg a day.  When on the DASH eating plan, aim to eat more fresh fruits and vegetables, whole grains, lean proteins, low-fat dairy, and heart-healthy fats.  Work with your health care provider or diet and nutrition specialist (dietitian) to adjust your eating plan to your individual calorie needs. This information is not intended to replace advice given to you by your health care provider. Make sure you discuss any questions you have with your health care provider. Document Revised: 06/28/2017 Document Reviewed: 07/09/2016 Elsevier Patient Education  [place].   https://www.cdc.gov/vaccines/hcp/vis/vis-statements/tdap.pdf">  Tdap (Tetanus, Diphtheria, Pertussis) Vaccine: What You Need to Know 1. Why get vaccinated? Tdap vaccine can prevent tetanus, diphtheria, and pertussis. Diphtheria and pertussis spread from person to person. Tetanus enters the body through cuts or wounds.  TETANUS (T) causes painful stiffening of the muscles. Tetanus can lead to serious health problems, including being unable to open the mouth, having trouble swallowing and breathing, or death.  DIPHTHERIA (D) can lead to difficulty breathing, heart failure, paralysis, or death.  PERTUSSIS (aP), also known as "whooping cough," can cause uncontrollable, violent coughing which makes it hard to breathe, eat, or drink. Pertussis can be extremely serious in babies and young children, causing pneumonia, convulsions, brain damage, or death. In teens and adults, it can cause weight loss, loss of bladder control, passing out, and rib fractures from severe coughing. 2. Tdap vaccine Tdap is only for children 7 years and older, adolescents, and adults.  Adolescents should receive a single dose of Tdap, preferably at age 24 or 51 years. Pregnant women should get a dose of Tdap during every  pregnancy, to protect the newborn from pertussis. Infants are most at risk for severe, life-threatening complications from pertussis. Adults who have never received Tdap should get a dose of Tdap. Also, adults should receive a booster  dose every 10 years, or earlier in the case of a severe and dirty wound or burn. Booster doses can be either Tdap or Td (a different vaccine that protects against tetanus and diphtheria but not pertussis). Tdap may be given at the same time as other vaccines. 3. Talk with your health care provider Tell your vaccine provider if the person getting the vaccine:  Has had an allergic reaction after a previous dose of any vaccine that protects against tetanus, diphtheria, or pertussis, or has any severe, life-threatening allergies.  Has had a coma, decreased level of consciousness, or prolonged seizures within 7 days after a previous dose of any pertussis vaccine (DTP, DTaP, or Tdap).  Has seizures or another nervous system problem.  Has ever had Guillain-Barr Syndrome (also called GBS).  Has had severe pain or swelling after a previous dose of any vaccine that protects against tetanus or diphtheria. In some cases, your health care provider may decide to postpone Tdap vaccination to a future visit.  People with minor illnesses, such as a cold, may be vaccinated. People who are moderately or severely ill should usually wait until they recover before getting Tdap vaccine.  Your health care provider can give you more information. 4. Risks of a vaccine reaction  Pain, redness, or swelling where the shot was given, mild fever, headache, feeling tired, and nausea, vomiting, diarrhea, or stomachache sometimes happen after Tdap vaccine. People sometimes faint after medical procedures, including vaccination. Tell your provider if you feel dizzy or have vision changes or ringing in the ears.  As with any medicine, there is a very remote chance of a vaccine causing a severe  allergic reaction, other serious injury, or death. 5. What if there is a serious problem? An allergic reaction could occur after the vaccinated person leaves the clinic. If you see signs of a severe allergic reaction (hives, swelling of the face and throat, difficulty breathing, a fast heartbeat, dizziness, or weakness), call 9-1-1 and get the person to the nearest hospital. For other signs that concern you, call your health care provider.  Adverse reactions should be reported to the Vaccine Adverse Event Reporting System (VAERS). Your health care provider will usually file this report, or you can do it yourself. Visit the VAERS website at www.vaers.SamedayNews.es or call 2073089769. VAERS is only for reporting reactions, and VAERS staff do not give medical advice. 6. The National Vaccine Injury Compensation Program The [employer] Vaccine Injury Compensation Program (VICP) is a federal program that was created to compensate people who may have been injured by certain vaccines. Visit the VICP website at GoldCloset.com.ee or call 479-518-8028 to learn about the program and about filing a claim. There is a time limit to file a claim for compensation. 7. How can I learn more?  Ask your health care provider.  Call your local or state health department.  Contact the Centers for Disease Control and Prevention (CDC): ? Call (928)081-5906 (1-800-CDC-INFO) or ? Visit CDC's website at http://hunter.com/ Vaccine Information Statement Tdap (Tetanus, Diphtheria, Pertussis) Vaccine (10/29/2018) This information is not intended to replace advice given to you by your health care provider. Make sure you discuss any questions you have with your health care provider. Document Revised: 11/07/2018 Document Reviewed: 11/10/2018 Elsevier Patient Education  [place].

## 2019-12-05 ENCOUNTER — Encounter: Payer: Self-pay | Admitting: Internal Medicine

## 2019-12-15 ENCOUNTER — Telehealth: Payer: Self-pay | Admitting: Internal Medicine

## 2019-12-15 NOTE — Telephone Encounter (Signed)
Insurance will not cover coronary(heart) CT scan for calcium  Does he want referral to cardiology?

## 2019-12-16 NOTE — Telephone Encounter (Signed)
Patient informed and verbalized understanding.  States he will call billing [phone]) to see what the out of pocket cost of this test is and will then call us back with his choice.

## 2019-12-29 ENCOUNTER — Other Ambulatory Visit: Payer: Self-pay

## 2019-12-29 ENCOUNTER — Ambulatory Visit: Payer: Managed Care, Other (non HMO) | Admitting: Family Medicine

## 2019-12-29 ENCOUNTER — Encounter: Payer: Self-pay | Admitting: Family Medicine

## 2019-12-29 VITALS — BP 142/83 | HR 71 | Ht 71.0 in | Wt 224.0 lb

## 2019-12-29 DIAGNOSIS — Z79899 Other long term (current) drug therapy: Secondary | ICD-10-CM

## 2019-12-29 DIAGNOSIS — F32A Depression, unspecified: Secondary | ICD-10-CM

## 2019-12-29 DIAGNOSIS — G35 Multiple sclerosis: Secondary | ICD-10-CM

## 2019-12-29 DIAGNOSIS — F329 Major depressive disorder, single episode, unspecified: Secondary | ICD-10-CM | POA: Diagnosis not present

## 2019-12-29 DIAGNOSIS — R5383 Other fatigue: Secondary | ICD-10-CM

## 2019-12-29 NOTE — Progress Notes (Signed)
PATIENT: Perry Gentry DOB: [birth year]  REASON FOR VISIT: follow up HISTORY FROM: patient  Chief Complaint  Patient presents with  . Follow-up    MS fu, rm 1, pt states he is doing well no concerns      HISTORY OF PRESENT ILLNESS: Today 12/29/19 Perry Gentry is a 54 y.o. male here today for follow up for RRMS. Tecfidera switched to Vumerity due cost of generic Tecfidera. He continues to tolerate Vumerity. He is feeling well today. Mood is stable. He discontinued Pristiq as he didn't feel that he needed this medication. He has not seen urology recently. He continues to have urgency but feels it is stable. Saw palmetto may have helped some but he is not currently taking this.   He was seen in 11/2019 for CPE. Labs were unremarkable with exception of elevated lipids. He was started on atorvastatin 10mg  and is tolerating it well. He admits that he has a sedentary lifestyle.   He denies vision or gait changes. No new numbness or weakness.    HISTORY: (copied from Dr Garth Bigness note on 04/14/2019)  Perry Gentry is a right handed 54 y.o. man with MS diagnosed in 2014.    Update 04/14/2019: He is on Tecfidera and he tolerates it well.   He denies any difficulty with gait, balance, strength .   He sees urology and has mild BPH and hesitancy.   He is on saw palmetto.   He gets some urgency.      He denies any problems with fatigue though he has been a little less active.     Cognition is doing well.   At the last visit, Pristiq was started for mild depression.   He is not sure it has helped any and would prefer to stop.     Update 08/15/18: He feels his MS is stable.   He is on Tecfidera and tolerates it well.     He has numbness in his hands doing about the same as last visit.  The quality is loss of sensation and mild tingling but not pain.   He feels the intensity is the same.   It does not wake him up.   His handwriting has not changed.   He feels some tactile issues in  his hands though.   This started a few years ago in fingertips and now involves all the fingers up to the palms.     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   No weakness.   He sees urologist due to a large prostate and saw palmetto was prescribed.    He has urinary urgency but not retention/hesitancy.   He notes fatigue and also has some apathy.  He feels mildly depressed. His wife tells him he gets angry too quickly.  He tried Wellbutrin in the past and did not feel well on it so stopped.      Update 11/13/2017: He feels his MS has been mostly stable though he is noticed some more numbness in his hands.  He has been on Tecfidera and tolerates it well.  He is not having any problems with flushing or GI upset.   The numbness and tingling has been present since the onset of symptoms but has slowly progressed toinclude the entire hand bilaterally.       I personally reviewed the  MRI of the brain dated 08/05/2016.  It shows a few  T2/FLAIR hyperintense foci in the   white matter, most nonspecific.  Perry Gentry  An MRI of the cervical spine in 2015 did show that he had a spinal cord plaque at C3-C4 more to the left and also has degenerative changes to the right C3-C4 and bilaterally at C6-C7.  In his 20's he had optic neuritis (left) and then the hand numbness in 2015.    From 07/06/2016: MS:   He is on Tecfidera and he tolerates Tecfidera well and denies significant flushing or GI upset.    He has some flushing if he takes on an empty stomach.  He denies any definite exacerbation but has noted more numbness in the hands and palms.   Hand Numbness:   He reports bilateral hand numbness, mostly in the middle three fingertips..    He does not have Phalen's or Tinel's sings but has awoken a few times with numbness in his hands,  once severe enough that he could not move his wrist x several minutes.   MRI of the cervical spine 2015 [place] shows a spinal cord plaque at C3-C4 that is central and slightly to the left.  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 Every once and awhile when he turns fast he feels slightly off balanced.   He lifts weights once or twice a week.   He does not note any imbalance with his eyes closed. He has noted numbness that was only in the fingertips last year is now  Involving the hands.   His hands are also sometimes stiff.    Tingling does not change depending on position.   No grip weakness.  He denies any weakness or numbness in his hands or legs.   Bladder/bowel: Bowel function is fine.   Prostate is enlarged and he sometimes has urinary urgency.   Ho hesitancy .  He has noted decreased libido and ED.    PSA has been fine  Vision:    Since his mid 58s, he has had decreased color saturation and a Vf defect on the left.   Right eye is fine  Fatigue/sleep: He feels energy is good most days.    He feels that he sleeps well most nights but not every night. Some nights he falls asleep but then wakes up and has trouble going back to sleep.   He is not always refreshed in the morning.    He snores some and he reports his wife has never commented to him about pauses or gasping at night.    He occasionally takes a 10 mg doxepin.     Mood/cognition: He feels mood is doing a little better.,    Buproprion had no effect so he stopped.    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   MS History:    In his 27s, he had visual changes in the left eye where he felt that was a veil over his vision.   Colors are still desaturated out of the left eye.   In 2010 he noted that when he would bend his neck forward he would have a  zinging sensation going down from the spine to both of his hands. Over a couple years this improved.  He was noted  numbness in his hands since the middle of 2014. Sometimes, he would drop items.  There was not any pain nor any weakness.  Perry Gentry He saw orthopedics for his hand numbness in 2014 and had an MRI of the cervical spine. The MRI showed plaque at the C2-C3 region consistent with multiple sclerosis. It also showed degenerative changes at C6-C7 with severe bilateral foraminal stenosis. A subsequent MRI of the brain showed several white matter lesions (2 periventricular, 1-2 cortical foci)  consistent with multiple sclerosis. No abnormal enhancement.   He was referred to Dr. Manuella Ghazi in [place] who diagnosed him with multiple sclerosis.   He was started on Tecfidera.    REVIEW OF SYSTEMS: Out of a complete 14 system review of symptoms, the patient complains only of the following symptoms, none and all other reviewed systems are negative.   ALLERGIES: No Known Allergies  HOME MEDICATIONS: Outpatient Medications Prior to Visit  Medication Sig Dispense Refill  . atorvastatin (LIPITOR) 10 MG tablet Take 1 tablet (10 mg total) by mouth at bedtime. 90 tablet 3  . cholecalciferol (VITAMIN D) 1000 UNITS tablet Take 1,000 Units by mouth daily.    . VUMERITY 231 MG CPDR Take 2 capsules by mouth twice daily 360 capsule 3  . doxycycline (VIBRA-TABS) 100 MG tablet Take 1 tablet (100 mg total) by mouth 2 (two) times daily. With food 20 tablet 0  . meloxicam (MOBIC) 7.5 MG tablet Take 1 tablet (7.5 mg total) by mouth 2 (two) times daily as needed for pain. 60 tablet 2   No facility-administered medications prior to visit.    PAST MEDICAL HISTORY: Past Medical History:  Diagnosis Date  . Basal cell carcinoma    forehead Dr. Evorn Gong in 2019   . Kidney stones    age 86  . Multiple sclerosis (HCC)    mild sxs in hand and left eye Dr. Kerman Passey neurology  . Vision abnormalities     PAST SURGICAL  HISTORY: Past Surgical History:  Procedure Laterality Date  . CYSTOSCOPY WITH STENT PLACEMENT    . LITHOTRIPSY     age 43 y.o   . VASECTOMY      FAMILY HISTORY: Family History  Problem Relation Age of Onset  . Hypertension Mother   . Hypertension Father     SOCIAL HISTORY: Social History   Socioeconomic History  . Marital status: Married    Spouse name: Not on file  . Number of children: Not on file  . Years of education: Not on file  . Highest education level: Not on file  Occupational History  . Not on file  Tobacco Use  . Smoking status: Never Smoker  . Smokeless tobacco: Never Used  Substance and Sexual Activity  . Alcohol use: Yes    Comment: "maybe once a month"  . Drug use: Not Currently  . Sexual activity: Yes    Partners: Female  Other Topics Concern  . 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 Difficulty of Paying Living Expenses:   Food Insecurity:   . Worried About Running  Out of Food in the Last Year:   . [place] in the Last Year:   Transportation Needs:   . Lack of Transportation (Medical):   Perry Gentry Lack of Transportation (Non-Medical):   Physical Activity:   . Days of Exercise per Week:   . Minutes of Exercise per Session:   Stress:   . Feeling of Stress :   Social Connections:   . Frequency of Communication with Friends and Family:   . Frequency of Social Gatherings with Friends and Family:   . Attends Religious Services:   . Active Member of Clubs or Organizations:   . Attends Archivist Meetings:   Perry Gentry Marital Status:   Intimate Partner Violence:   . Fear of Current or Ex-Partner:   . Emotionally Abused:   Perry Gentry Physically Abused:   .  Sexually Abused:       PHYSICAL EXAM  Vitals:   12/29/19 0931  BP: (!) 142/83  Pulse: 71  Weight: 224 lb (101.6 kg)  Height: 5\' 11"  (1.803 m)   Body mass index is 31.24 kg/m.  Generalized: Well developed, in no acute distress  Cardiology: normal rate and rhythm, no murmur noted Respiratory: clear to auscultation bilaterally  Neurological examination  Mentation: Alert oriented to time, place, history taking. Follows all commands speech and language fluent Cranial nerve II-XII: Pupils were equal round reactive to light. Extraocular movements were full, visual field were full on confrontational test. Facial sensation and strength were normal. Uvula tongue midline. Head turning and shoulder shrug  were normal and symmetric. Motor: The motor testing reveals 5 over 5 strength of all 4 extremities. Good symmetric motor tone is noted throughout.  Sensory: Sensory testing is intact to soft touch on all 4 extremities. No evidence of extinction is noted.  Coordination: Cerebellar testing reveals good finger-nose-finger and heel-to-shin bilaterally.  Gait and station: Gait is normal. Tandem gait is normal. Romberg is negative. No drift is seen.  Reflexes: Deep tendon reflexes are symmetric and normal bilaterally.   DIAGNOSTIC DATA (LABS, IMAGING, TESTING) - I reviewed patient records, labs, notes, testing and imaging myself where available.  No flowsheet data found.   Lab Results  Component Value Date   WBC 8.6 12/01/2019   HGB 15.2 12/01/2019   HCT 44.5 12/01/2019   MCV 83.8 12/01/2019   PLT 190.0 12/01/2019      Component Value Date/Time   NA 139 12/01/2019 0805   NA 144 04/14/2019 1638   K 4.2 12/01/2019 0805   CL 106 12/01/2019 0805   CO2 26 12/01/2019 0805   GLUCOSE 96 12/01/2019 0805   BUN 14 12/01/2019 0805   BUN 14 04/14/2019 1638   CREATININE 1.07 12/01/2019 0805   CALCIUM 9.4 12/01/2019 0805   PROT 7.3 12/01/2019 0805   PROT 7.3 04/14/2019 1638   ALBUMIN 4.5  12/01/2019 0805   ALBUMIN 5.0 (H) 04/14/2019 1638   AST 17 12/01/2019 0805   ALT 27 12/01/2019 0805   ALKPHOS 84 12/01/2019 0805   BILITOT 0.5 12/01/2019 0805   BILITOT 0.4 04/14/2019 1638   GFRNONAA 75 04/14/2019 1638   GFRAA 87 04/14/2019 1638   Lab Results  Component Value Date   CHOL 258 (H) 12/01/2019   HDL 45.00 12/01/2019   LDLCALC 184 (H) 12/01/2019   TRIG 143.0 12/01/2019   CHOLHDL 6 12/01/2019   No results found for: HGBA1C No results found for: VITAMINB12 Lab Results  Component Value Date   TSH 1.93 12/01/2019  ASSESSMENT AND PLAN 54 y.o. year old male  has a past medical history of Basal cell carcinoma, Kidney stones, Multiple sclerosis ([place]), and Vision abnormalities. here with     ICD-10-CM   1. Relapsing remitting multiple sclerosis ([place])  G35 CBC with Differential/Platelets    CMP  2. High risk medication use  Z79.899 CBC with Differential/Platelets    CMP  3. Depression, unspecified depression type  F32.9   4. Other fatigue  R53.83     He continues to do well on Vumerity. We will continue current therapy. Hold MRI for now due to cost. Will obtain if any new/worsening symptoms. Labs stable with PCP. He will continue close follow up with PCP for cholesterol management. Depression is stable. I have encouraged him to consider healthy lifestyle changes with heart healthy diet and regular exercise. He will follow up with [place] in 6 months, sooner if needed. He verbalizes understanding and agreement with this plan.    No orders of the defined types were placed in this encounter.    No orders of the defined types were placed in this encounter.     I spent 15 minutes with the patient. 50% of this time was spent counseling and educating patient on plan of care and medications.    Debbora Presto, FNP-C 12/29/2019, 9:42 AM [facility] [address], [place], [place] [address] [phone]

## 2019-12-29 NOTE — Progress Notes (Signed)
I have read the note, and I agree with the clinical assessment and plan.  Jemina Scahill A. Shandi Godfrey, MD, PhD, FAAN Certified in Neurology, Clinical Neurophysiology, Sleep Medicine, Pain Medicine and Neuroimaging  [facility] [address], [address] [place], [place] [address] [phone]  

## 2019-12-29 NOTE — Patient Instructions (Addendum)
We will continue Vumerity.   Work on healthy lifestyle habits with heart healthy diet and regular exercise.   Follow up in 6 months    Multiple Sclerosis Multiple sclerosis (MS) is a disease of the brain, spinal cord, and optic nerves (central nervous system). It causes the body's disease-fighting (immune) system to destroy the protective covering (myelin sheath) around nerves in the brain. When this happens, signals (nerve impulses) going to and from the brain and spinal cord do not get sent properly or may not get sent at all. There are several types of MS:  Relapsing-remitting MS. This is the most common type. This causes sudden attacks of symptoms. After an attack, you may recover completely until the next attack, or some symptoms may remain permanently.  Secondary progressive MS. This usually develops after the onset of relapsing-remitting MS. Similar to relapsing-remitting MS, this type also causes sudden attacks of symptoms. Attacks may be less frequent, but symptoms slowly get worse (progress) over time.  Primary progressive MS. This causes symptoms that steadily progress over time. This type of MS does not cause sudden attacks of symptoms. The age of onset of MS varies, but it often develops between 31-72 years of age. MS is a lifelong (chronic) condition. There is no cure, but treatment can help slow down the progression of the disease. What are the causes? The cause of this condition is not known. What increases the risk? You are more likely to develop this condition if:  You are a woman.  You have a relative with MS. However, the condition is not passed from parent to child (inherited).  You have a lack (deficiency) of vitamin D.  You smoke. MS is more common in the [place] than in the [place]. What are the signs or symptoms? Relapsing-remitting and secondary progressive MS cause symptoms to occur in episodes or attacks that may last weeks to  months. There may be long periods between attacks in which there are almost no symptoms. Primary progressive MS causes symptoms to steadily progress after they develop. Symptoms of MS vary because of the many different ways it affects the central nervous system. The main symptoms include:  Vision problems and eye pain.  Numbness.  Weakness.  Inability to move your arms, hands, feet, or legs (paralysis).  Balance problems.  Shaking that you cannot control (tremors).  Muscle spasms.  Problems with thinking (cognitive changes). MS can also cause symptoms that are associated with the disease, but are not always the direct result of an MS attack. They may include:  Inability to control urination or bowel movements (incontinence).  Headaches.  Fatigue.  Inability to tolerate heat.  Emotional changes.  Depression.  Pain. How is this diagnosed? This condition is diagnosed based on:  Your symptoms.  A neurological exam. This involves checking central nervous system function, such as nerve function, reflexes, and coordination.  MRIs of the brain and spinal cord.  Lab tests, including a lumbar puncture that tests the fluid that surrounds the brain and spinal cord (cerebrospinal fluid).  Tests to measure the electrical activity of the brain in response to stimulation (evoked potentials). How is this treated? There is no cure for MS, but medicines can help decrease the number and frequency of attacks and help relieve nuisance symptoms. Treatment options may include:  Medicines that reduce the frequency of attacks. These medicines may be given by injection, by mouth (orally), or through an IV.  Medicines that reduce inflammation (steroids). These may  provide short-term relief of symptoms.  Medicines to help control pain, depression, fatigue, or incontinence.  Vitamin D, if you have a deficiency.  Using devices to help you move around (assistive devices), such as braces, a  cane, or a walker.  Physical therapy to strengthen and stretch your muscles.  Occupational therapy to help you with everyday tasks.  Alternative or complementary treatments such as exercise, massage, or acupuncture. Follow these instructions at home:  Take over-the-counter and prescription medicines only as told by your health care provider.  Do not drive or use heavy machinery while taking prescription pain medicine.  Use assistive devices as recommended by your physical therapist or your health care provider.  Exercise as directed by your health care provider.  Return to your normal activities as told by your health care provider. Ask your health care provider what activities are safe for you.  Reach out for support. Share your feelings with friends, family, or a support group.  Keep all follow-up visits as told by your health care provider and therapists. This is important. Where to find more information  National Multiple Sclerosis Society: https://www.nationalmssociety.org Contact a health care provider if:  You feel depressed.  You develop new pain or numbness.  You have tremors.  You have problems with sexual function. Get help right away if:  You develop paralysis.  You develop numbness.  You have problems with your bladder or bowel function.  You develop double vision.  You lose vision in one or both eyes.  You develop suicidal thoughts.  You develop severe confusion. If you ever feel like you may hurt yourself or others, or have thoughts about taking your own life, get help right away. You can go to your nearest emergency department or call:  Your local emergency services (911 in the [place]  A suicide crisis helpline, such as the [place] at 513 017 4722. This is open 24 hours a day. Summary  Multiple sclerosis (MS) is a disease of the central nervous system that causes the body's immune system to destroy the protective  covering (myelin sheath) around nerves in the brain.  There are 3 types of MS: relapsing-remitting, secondary progressive, and primary progressive. Relapsing-remitting and secondary progressive MS cause symptoms to occur in episodes or attacks that may last weeks to months. Primary progressive MS causes symptoms to steadily progress after they develop.  There is no cure for MS, but medicines can help decrease the number and frequency of attacks and help relieve nuisance symptoms. Treatment may also include physical or occupational therapy.  If you develop numbness, paralysis, vision problems, or other neurological symptoms, get help right away. This information is not intended to replace advice given to you by your health care provider. Make sure you discuss any questions you have with your health care provider. Document Revised: 06/28/2017 Document Reviewed: 09/24/2016 Elsevier Patient Education  2020 [employer].

## 2020-03-14 ENCOUNTER — Ambulatory Visit
Admission: RE | Admit: 2020-03-14 | Discharge: 2020-03-14 | Disposition: A | Discharge status: Home / Self Care | Payer: Self-pay | Source: Ambulatory Visit | Attending: Internal Medicine | Admitting: Internal Medicine

## 2020-03-14 ENCOUNTER — Other Ambulatory Visit: Payer: Self-pay

## 2020-03-14 DIAGNOSIS — E785 Hyperlipidemia, unspecified: Secondary | ICD-10-CM | POA: Insufficient documentation

## 2020-03-14 DIAGNOSIS — E7801 Familial hypercholesterolemia: Secondary | ICD-10-CM | POA: Insufficient documentation

## 2020-04-05 ENCOUNTER — Other Ambulatory Visit: Payer: Self-pay

## 2020-04-05 ENCOUNTER — Other Ambulatory Visit: Payer: Managed Care, Other (non HMO)

## 2020-04-06 ENCOUNTER — Other Ambulatory Visit (INDEPENDENT_AMBULATORY_CARE_PROVIDER_SITE_OTHER): Payer: Managed Care, Other (non HMO)

## 2020-04-06 DIAGNOSIS — E785 Hyperlipidemia, unspecified: Secondary | ICD-10-CM | POA: Diagnosis not present

## 2020-04-06 DIAGNOSIS — E7801 Familial hypercholesterolemia: Secondary | ICD-10-CM | POA: Diagnosis not present

## 2020-04-06 LAB — LIPID PANEL
Cholesterol: 176 mg/dL (ref 0–200)
HDL: 52.2 mg/dL (ref >39.00)
LDL Cholesterol: 108 mg/dL — ABNORMAL HIGH (ref 0–99)
NonHDL: 123.84
Total CHOL/HDL Ratio: 3
Triglycerides: 80 mg/dL (ref 0.0–149.0)
VLDL: 16 mg/dL (ref 0.0–40.0)

## 2020-04-06 LAB — COMPREHENSIVE METABOLIC PANEL
ALT: 37 U/L (ref 0–53)
AST: 18 U/L (ref 0–37)
Albumin: 4.7 g/dL (ref 3.5–5.2)
Alkaline Phosphatase: 75 U/L (ref 39–117)
BUN: 15 mg/dL (ref 6–23)
CO2: 28 mEq/L (ref 19–32)
Calcium: 9.7 mg/dL (ref 8.4–10.5)
Chloride: 107 mEq/L (ref 96–112)
Creatinine, Ser: 0.98 mg/dL (ref 0.40–1.50)
GFR: 79.37 mL/min (ref >60.00)
Glucose, Bld: 96 mg/dL (ref 70–99)
Potassium: 4.7 mEq/L (ref 3.5–5.1)
Sodium: 142 mEq/L (ref 135–145)
Total Bilirubin: 0.5 mg/dL (ref 0.2–1.2)
Total Protein: 7.3 g/dL (ref 6.0–8.3)

## 2020-04-07 ENCOUNTER — Encounter: Payer: Self-pay | Admitting: Internal Medicine

## 2020-04-07 ENCOUNTER — Other Ambulatory Visit: Payer: Self-pay

## 2020-04-07 ENCOUNTER — Telehealth (INDEPENDENT_AMBULATORY_CARE_PROVIDER_SITE_OTHER): Payer: Managed Care, Other (non HMO) | Admitting: Internal Medicine

## 2020-04-07 VITALS — Ht 71.0 in | Wt 215.0 lb

## 2020-04-07 DIAGNOSIS — R0683 Snoring: Secondary | ICD-10-CM | POA: Diagnosis not present

## 2020-04-07 DIAGNOSIS — G4700 Insomnia, unspecified: Secondary | ICD-10-CM

## 2020-04-07 DIAGNOSIS — E785 Hyperlipidemia, unspecified: Secondary | ICD-10-CM | POA: Diagnosis not present

## 2020-04-07 DIAGNOSIS — W57XXXA Bitten or stung by nonvenomous insect and other nonvenomous arthropods, initial encounter: Secondary | ICD-10-CM

## 2020-04-07 DIAGNOSIS — L929 Granulomatous disorder of the skin and subcutaneous tissue, unspecified: Secondary | ICD-10-CM | POA: Diagnosis not present

## 2020-04-07 NOTE — Progress Notes (Signed)
Virtual Visit via Video Note  I connected with Perry Gentry  on 04/07/20 at  9:15 AM EDT by a video enabled telemedicine application and verified that I am speaking with the correct person using two identifiers.  Location patient: home Location provider:work or home office Persons participating in the virtual visit: patient, provider  I discussed the limitations of evaluation and management by telemedicine and the availability of in person appointments. The patient expressed understanding and agreed to proceed.   HPI: 1. f/u  doing well HLD improved tolerating lipitor 10 mg qhs and CT calcium score 8/16 low risk 5.28 rec aspirin 81 taking 1/2 pill bid and monitor BP he will start doing this 2. Right wrist pain better given steroid injection w/in the week and brace  3. Tick bite abdomen bump reduced in size and if does not resolve rec see derm  4. Snoring intermittently but declines sleep study for now  5. Insomnia with trouble staying asleep trying otc supplements waking up 5 or 6 am disc trazodone, lunesta, restoril, ambien in the future     ROS: See pertinent positives and negatives per HPI.  Past Medical History:  Diagnosis Date   Basal cell carcinoma    forehead Dr. Evorn Gong in 2019    Kidney stones    age 54   Multiple sclerosis ([place])    mild sxs in hand and left eye Dr. Kerman Passey neurology   Vision abnormalities     Past Surgical History:  Procedure Laterality Date   CYSTOSCOPY WITH STENT PLACEMENT     LITHOTRIPSY     age 10 y.o    VASECTOMY      Family History  Problem Relation Age of Onset   Hypertension Mother    Hypertension Father     SOCIAL HX: married   Current Outpatient Medications:    atorvastatin (LIPITOR) 10 MG tablet, Take 1 tablet (10 mg total) by mouth at bedtime., Disp: 90 tablet, Rfl: 3   cholecalciferol (VITAMIN D) 1000 UNITS tablet, Take 1,000 Units by mouth daily., Disp: , Rfl:    VUMERITY 231 MG CPDR, Take 2 capsules by mouth twice  daily, Disp: 360 capsule, Rfl: 3  EXAM:  VITALS per patient if applicable:  GENERAL: alert, oriented, appears well and in no acute distress  HEENT: atraumatic, conjunttiva clear, no obvious abnormalities on inspection of external nose and ears  NECK: normal movements of the head and neck  LUNGS: on inspection no signs of respiratory distress, breathing rate appears normal, no obvious gross SOB, gasping or wheezing  CV: no obvious cyanosis  MS: moves all visible extremities without noticeable abnormality  PSYCH/NEURO: pleasant and cooperative, no obvious depression or anxiety, speech and thought processing grossly intact  ASSESSMENT AND PLAN:  Discussed the following assessment and plan:  Hyperlipidemia, unspecified hyperlipidemia type Improved on lipitor 10 mg qhs  CT calcium score 03/14/20 low risk score 5.27 mild  rec BP monitor  Aspirin 81 mg qd doing 1/2 pill bid   Granuloma due to tick bite F/u derm if does not resolve  Snoring Consider sleep study in future  Will try wt loss   Insomnia, unspecified type disc trazodone, lunesta, restoril, ambien in the future   Trazodone had DDI with aspirin and lipitor so others may be options will message me back   HM-CPE at f/u in 6 months in person Flu shot never had  tdap rx sent to pharmacy  Disc shingrix x 2 doses consider in future covid vx 2/2 utd  moderna  Colonoscopy disc today disc [facility] Dr Cristina Gong pt to call back when ready for referral  Labs reviewed  Normal PSA 0.83 12/01/19 declines had urology Dr. Farrel Gordon established   Skin Dr. Evorn Gong h/o [facility]   -we discussed possible serious and likely etiologies, options for evaluation and workup, limitations of telemedicine visit vs in person visit, treatment, treatment risks and precautions. Pt prefers to treat via telemedicine empirically rather then risking or undertaking an in person visit at this moment.  Work/School slipped offered: Advised to seek prompt follow  up telemedicine visit or in person care if worsening, new symptoms arise, or if is not improving with treatment. Did let her know that I only do telemedicine on Tuesdays and Thursdays for [place] and advised follow up visit with PCP or [facility] if needs follow up or if any further questions arise to avoid any delays.   I discussed the assessment and treatment plan with the patient. The patient was provided an opportunity to ask questions and all were answered. The patient agreed with the plan and demonstrated an understanding of the instructions.   The patient was advised to call back or seek an in-person evaluation if the symptoms worsen or if the condition fails to improve as anticipated.  Time spent 20 minutes Delorise Jackson, MD

## 2020-06-13 ENCOUNTER — Telehealth: Payer: Self-pay | Admitting: *Deleted

## 2020-06-13 NOTE — Telephone Encounter (Signed)
Faxed completed/signed PA vumerity back to [employer] at [phone]. Received fax confirmation. Waiting on determination.

## 2020-06-16 NOTE — Telephone Encounter (Signed)
Called to check on status of PA. Spoke with re: Thomas with [employer]. They stated they only received the last two pages, tried reaching out to our office. Advised we need to re-send. I marked as urgent and re-sent PA request to [phone]. Received fax confirmation.

## 2020-06-21 NOTE — Telephone Encounter (Signed)
Called [employer] to check on status of PA. Spoke with [employer]. She advised PA approved effective 06/16/20-06/16/21. [id number]. She will re-fax approval letter to [phone].

## 2020-06-29 ENCOUNTER — Ambulatory Visit: Payer: Managed Care, Other (non HMO) | Admitting: Neurology

## 2020-06-29 ENCOUNTER — Encounter: Payer: Self-pay | Admitting: Neurology

## 2020-06-29 ENCOUNTER — Telehealth: Payer: Self-pay | Admitting: Neurology

## 2020-06-29 VITALS — BP 125/70 | HR 79 | Ht 71.0 in | Wt 219.0 lb

## 2020-06-29 DIAGNOSIS — R5383 Other fatigue: Secondary | ICD-10-CM

## 2020-06-29 DIAGNOSIS — F32A Depression, unspecified: Secondary | ICD-10-CM | POA: Diagnosis not present

## 2020-06-29 DIAGNOSIS — G35 Multiple sclerosis: Secondary | ICD-10-CM | POA: Diagnosis not present

## 2020-06-29 DIAGNOSIS — Z79899 Other long term (current) drug therapy: Secondary | ICD-10-CM | POA: Diagnosis not present

## 2020-06-29 DIAGNOSIS — R200 Anesthesia of skin: Secondary | ICD-10-CM

## 2020-06-29 NOTE — Telephone Encounter (Signed)
[employer] order sent to GI. They will obtain the auth and reach out to the patient to schedule.  

## 2020-06-29 NOTE — Progress Notes (Signed)
[facility]  PATIENT: Perry Gentry DOB: 23-Jun-1966  REFERRING DOCTOR OR PCP:  [place] Pink SOURCE: patient and records  _________________________________   HISTORICAL  CHIEF COMPLAINT:  Chief Complaint  Patient presents with  . Follow-up    RM 13, alone. Last seen 12/29/19  . Multiple Sclerosis    On vumerity. Doing well.     HISTORY OF PRESENT ILLNESS:  Perry Gentry is a right handed 54 y.o. man with MS diagnosed in 2014.    Update 06/29/2020: He is on Vumerity and he tolerates it well (Tecfidera and DMF had high co-pays).   He denies any difficulty with gait, balance, strength . He had had some numbness and has borderline CTS.   He sees urology for mild BPH and urianry hesitancy.  No incontinence.   He is on saw palmetto.    Vision is doing well.  He does not note any issues with fatigue though he is less active since Covid-19.   He is no longer exercising.  Cognition is doing well.   He has mild depression.  Pristiq had not helped and he stopped.       MS History:     In his 63s, he had visual changes in the left eye where he felt that was a veil over his vision.   Colors are still desaturated out of the left eye.   In 2010 he noted that when he would bend his neck forward he would have a zinging sensation going down from the spine to both of his hands. Over a couple years this improved.  He was noted numbness in his hands since the middle of 2014. Sometimes, he would drop items.  There was not any pain nor any weakness.  [name] He saw orthopedics for his hand numbness in 2014 and had an MRI of the cervical spine. The MRI showed plaque at the C2-C3 region consistent with multiple sclerosis. It also showed degenerative changes at C6-C7 with severe bilateral foraminal stenosis. A subsequent MRI of the brain showed several white matter lesions (2 periventricular, 1-2 cortical foci)  consistent with multiple sclerosis. No abnormal enhancement.   He was  referred to Dr. Manuella Ghazi in [place] who diagnosed him with multiple sclerosis.   He was started on Tecfidera.   STUDIES MRI of the brain 08/05/2016 shows a few  T2/FLAIR hyperintense foci in the  white matter, most nonspecific.  [name]  MRI of the cervical spine 2015 showed a spinal cord plaque at C3-C4 more to the left and also has degenerative changes to the right C3-C4 and bilaterally at C6-C7.  MRI brain 11/26/17 showed several T2/FLAIR hyperintense foci in the white matter of the hemispheres.  This is a nonspecific finding and could be due to chronic demyelinating plaque associated with multiple sclerosis but could also be due to chronic microvascular ischemic change.  None of the foci appears to be acute.   There is a normal enhancement pattern and there are no acute findings.   MRI cervical spine 11/26/2017 showed abnormal signal within the spinal cord adjacent to C3-C4.  This is unchanged when compared to the 11/03/2013 MRI     Multilevel degenerative changes as detailed above.  At C3-C4, there is moderately severe right foraminal narrowing that could lead to right C4 nerve root compression.  At C6-C7, there is borderline spinal stenosis and moderately severe left foraminal narrowing that could lead to left C7 nerve root compression.  NCV/EMG 12/31/2017 shows Mild left chronic C7 radiculopathy  without active features.    Borderline carpal tunnel syndromes   REVIEW OF SYSTEMS: Constitutional: No fevers, chills, sweats, or change in .   He notes some fatigue Eyes: No visual changes, double vision, eye pain Ear, nose and throat: No hearing loss, ear pain, nasal congestion, sore throat Cardiovascular: No chest pain, palpitations Respiratory: No shortness of breath at rest or with exertion.   No wheezes GastrointestinaI: No nausea, vomiting, diarrhea, abdominal pain, fecal incontinence Genitourinary: No dysuria, urinary retention or frequency.  No nocturia.   He has ED and decreased  libido Musculoskeletal: No neck pain, back pain Integumentary: No rash, pruritus, skin lesions Neurological: as above Psychiatric: as above Endocrine: No palpitations, diaphoresis, change in appetite, change in weigh or increased thirst Hematologic/Lymphatic: No anemia, purpura, petechiae. Allergic/Immunologic: No itchy/runny eyes, nasal congestion, recent allergic reactions, rashes  ALLERGIES: No Known Allergies  HOME MEDICATIONS:  Current Outpatient Medications:  .  atorvastatin (LIPITOR) 10 MG tablet, Take 1 tablet (10 mg total) by mouth at bedtime., Disp: 90 tablet, Rfl: 3 .  cholecalciferol (VITAMIN D) 1000 UNITS tablet, Take 1,000 Units by mouth daily., Disp: , Rfl:  .  VUMERITY 231 MG CPDR, Take 2 capsules by mouth twice daily, Disp: 360 capsule, Rfl: 3  PAST MEDICAL HISTORY: Past Medical History:  Diagnosis Date  . Basal cell carcinoma    forehead Dr. Evorn Gong in 2019   . Kidney stones    age 59  . Multiple sclerosis ([facility])    mild sxs in hand and left eye Dr. Kerman Passey neurology  . Vision abnormalities     PAST SURGICAL HISTORY: Past Surgical History:  Procedure Laterality Date  . CYSTOSCOPY WITH STENT PLACEMENT    . LITHOTRIPSY     age 67 y.o   . VASECTOMY      FAMILY HISTORY: Family History  Problem Relation Age of Onset  . Hypertension Mother   . Hypertension Father     SOCIAL HISTORY:  Social History   Socioeconomic History  . Marital status: Married    Spouse name: Not on file  . Number of children: Not on file  . Years of education: Not on file  . Highest education level: Not on file  Occupational History  . Not on file  Tobacco Use  . Smoking status: Never Smoker  . Smokeless tobacco: Never Used  Vaping Use  . Vaping Use: Never used  Substance and Sexual Activity  . Alcohol use: Yes    Comment: "maybe once a month"  . Drug use: Not Currently  . Sexual activity: Yes    Partners: Female  Other Topics Concern  . 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 Difficulty of Paying Living Expenses: Not on file  Food Insecurity:   . Worried About Charity fundraiser in the Last Year: Not on file  . Ran Out of Food in the Last Year: Not on file  Transportation Needs:   . Lack of Transportation (Medical): Not on file  . Lack of Transportation (Non-Medical): Not on file  Physical Activity:   .  Days of Exercise per Week: Not on file  . Minutes of Exercise per Session: Not on file  Stress:   . Feeling of Stress : Not on file  Social Connections:   . Frequency of Communication with Friends and Family: Not on file  . Frequency of Social Gatherings with Friends and Family: Not on file  . Attends Religious Services: Not on file  . Active Member of Clubs or Organizations: Not on file  . Attends Archivist Meetings: Not on file  . Marital Status: Not on file  Intimate Partner Violence:   . Fear of Current or Ex-Partner: Not on file  . Emotionally Abused: Not on file  . Physically Abused: Not on file  . Sexually Abused: Not on file     PHYSICAL EXAM  Vitals:   06/29/20 1013  BP: 125/70  Pulse: 79  SpO2: 98%  Weight: 219 lb (99.3 kg)  Height: 5\' 11"  (1.803 m)    Body mass index is 30.54 kg/m.   General: The patient is well-developed and well-nourished and in no acute distress   Neurologic Exam  Mental status: The patient is alert and oriented x 3 at the time of the examination. The patient has apparent normal recent and remote memory, with an apparently normal attention span and concentration ability.   Speech is normal.  Cranial nerves: Extraocular movements are full.  Reduced color vision OS.   Facial strength and  sensation is normal.  Trapezius strength is normal no obvious hearing deficits are noted.  Motor:  Muscle bulk is normal.   Muscle tone is normal.  Strength is 5/5.[name]   Sensory: Sensory testing is intact to touch and vibration in the arms and legs.  Coordination: Cerebellar testing reveals good finger-nose-finger and heel-to-shin bilaterally.  Gait and station: Station is normal.  Gait is normal.  Minimally wide tandem.  Romberg is negative.  Reflexes: Deep tendon reflexes are symmetric and normal bilaterally.        ASSESSMENT AND PLAN  Relapsing remitting multiple sclerosis ([place]) - Plan: MR BRAIN W WO CONTRAST, CBC with Differential/Platelet, Comprehensive metabolic panel  High risk medication use - Plan: CBC with Differential/Platelet, Comprehensive metabolic panel  Other fatigue  Depression, unspecified depression type  Hand numbness   1.    Continue Vumerity.     We will check blood work today including CBC with differential, CMP.  Check an MRI of the brain to determine if he is having any subclinical progression.  If this is occurring, consider a switch to different disease modifying therapy. 2.   He has mild depression but prefers to remain off a medication.     If mood worsens he will let us know and we will try different medication 3.    He will return to see me in 6 months if stable but call sooner if he has any significant new or worsening neurologic symptoms   Cotton Beckley A. Felecia Shelling, MD, PhD 19/12/2227, 79:89 AM Certified in Neurology, Clinical Neurophysiology, Sleep Medicine, Pain Medicine and Neuroimaging  [facility] [address] [place], [place] [address] [phone]

## 2020-06-30 LAB — COMPREHENSIVE METABOLIC PANEL
ALT: 45 IU/L — ABNORMAL HIGH (ref 0–44)
AST: 20 IU/L (ref 0–40)
Albumin/Globulin Ratio: 1.7 (ref 1.2–2.2)
Albumin: 4.5 g/dL (ref 3.8–4.9)
Alkaline Phosphatase: 79 IU/L (ref 44–121)
BUN/Creatinine Ratio: 13 (ref 9–20)
BUN: 13 mg/dL (ref 6–24)
Bilirubin Total: 0.5 mg/dL (ref 0.0–1.2)
CO2: 24 mmol/L (ref 20–29)
Calcium: 9.7 mg/dL (ref 8.7–10.2)
Chloride: 105 mmol/L (ref 96–106)
Creatinine, Ser: 1.02 mg/dL (ref 0.76–1.27)
GFR calc Af Amer: 95 mL/min/{1.73_m2} (ref >59)
GFR calc non Af Amer: 82 mL/min/{1.73_m2} (ref >59)
Globulin, Total: 2.6 g/dL (ref 1.5–4.5)
Glucose: 88 mg/dL (ref 65–99)
Potassium: 5.3 mmol/L — ABNORMAL HIGH (ref 3.5–5.2)
Sodium: 143 mmol/L (ref 134–144)
Total Protein: 7.1 g/dL (ref 6.0–8.5)

## 2020-06-30 LAB — CBC WITH DIFFERENTIAL/PLATELET
Basophils Absolute: 0.1 10*3/uL (ref 0.0–0.2)
Basos: 1 %
EOS (ABSOLUTE): 0.1 10*3/uL (ref 0.0–0.4)
Eos: 2 %
Hematocrit: 45 % (ref 37.5–51.0)
Hemoglobin: 15 g/dL (ref 13.0–17.7)
Immature Grans (Abs): 0 10*3/uL (ref 0.0–0.1)
Immature Granulocytes: 0 %
Lymphocytes Absolute: 1.7 10*3/uL (ref 0.7–3.1)
Lymphs: 24 %
MCH: 28.2 pg (ref 26.6–33.0)
MCHC: 33.3 g/dL (ref 31.5–35.7)
MCV: 85 fL (ref 79–97)
Monocytes Absolute: 0.6 10*3/uL (ref 0.1–0.9)
Monocytes: 9 %
Neutrophils Absolute: 4.4 10*3/uL (ref 1.4–7.0)
Neutrophils: 64 %
Platelets: 173 10*3/uL (ref 150–450)
RBC: 5.32 x10E6/uL (ref 4.14–5.80)
RDW: 12.7 % (ref 11.6–15.4)
WBC: 6.8 10*3/uL (ref 3.4–10.8)

## 2020-11-30 ENCOUNTER — Other Ambulatory Visit: Payer: Self-pay | Admitting: Neurology

## 2020-11-30 DIAGNOSIS — G35 Multiple sclerosis: Secondary | ICD-10-CM

## 2020-12-06 ENCOUNTER — Encounter: Payer: Self-pay | Admitting: Internal Medicine

## 2020-12-06 ENCOUNTER — Ambulatory Visit: Payer: Managed Care, Other (non HMO) | Admitting: Internal Medicine

## 2020-12-06 ENCOUNTER — Other Ambulatory Visit: Payer: Self-pay

## 2020-12-06 VITALS — BP 112/76 | HR 69 | Temp 97.4°F | Ht 71.0 in | Wt 224.4 lb

## 2020-12-06 DIAGNOSIS — W57XXXA Bitten or stung by nonvenomous insect and other nonvenomous arthropods, initial encounter: Secondary | ICD-10-CM

## 2020-12-06 DIAGNOSIS — Z0000 Encounter for general adult medical examination without abnormal findings: Secondary | ICD-10-CM | POA: Diagnosis not present

## 2020-12-06 DIAGNOSIS — Z1283 Encounter for screening for malignant neoplasm of skin: Secondary | ICD-10-CM

## 2020-12-06 DIAGNOSIS — Z23 Encounter for immunization: Secondary | ICD-10-CM

## 2020-12-06 DIAGNOSIS — L570 Actinic keratosis: Secondary | ICD-10-CM

## 2020-12-06 DIAGNOSIS — Z1211 Encounter for screening for malignant neoplasm of colon: Secondary | ICD-10-CM

## 2020-12-06 DIAGNOSIS — E785 Hyperlipidemia, unspecified: Secondary | ICD-10-CM

## 2020-12-06 DIAGNOSIS — S30860A Insect bite (nonvenomous) of lower back and pelvis, initial encounter: Secondary | ICD-10-CM

## 2020-12-06 DIAGNOSIS — Z1329 Encounter for screening for other suspected endocrine disorder: Secondary | ICD-10-CM

## 2020-12-06 DIAGNOSIS — G35 Multiple sclerosis: Secondary | ICD-10-CM

## 2020-12-06 MED ORDER — CLOBETASOL PROPIONATE 0.05 % EX CREA: 1.0000 "application " | TOPICAL_CREAM | Freq: Two times a day (BID) | CUTANEOUS | 0 refills | Status: DC

## 2020-12-06 MED ORDER — SHINGRIX 50 MCG/0.5ML IM SUSR: 0.5000 mL | Freq: Once | INTRAMUSCULAR | 0 refills | Status: AC

## 2020-12-06 MED ORDER — DOXYCYCLINE HYCLATE 100 MG PO TABS: 100.0000 mg | ORAL_TABLET | Freq: Two times a day (BID) | ORAL | 0 refills | Status: DC

## 2020-12-06 MED ORDER — TETANUS-DIPHTH-ACELL PERTUSSIS 5-2.5-18.5 LF-MCG/0.5 IM SUSP: 0.5000 mL | Freq: Once | INTRAMUSCULAR | 0 refills | Status: AC

## 2020-12-06 NOTE — Patient Instructions (Addendum)
Consider 4th booster moderna 5-6 months after 3rd dose risks MS * Think about Tdap vaccine and shingles vaccine shingrix (2nd dose <6 months of the 1st)   Stress relax brand Tranquil sleep melatonin, L theanine, serotonin amazon or whole foods  Or  L theanine 100-200 mg over the counter nature made brand    Tick Bite Information, Adult Ticks are insects that draw blood for food. Most ticks live in shrubs and grassy and wooded areas. They climb onto people and animals that brush against the leaves and grasses that they rest on. Then they bite, attaching themselves to the skin. Most ticks are harmless, but some ticks may carry germs that can spread to a person through a bite and cause a disease. To reduce your risk of getting a disease from a tick bite, make sure you:  Take steps to prevent tick bites.  Check for ticks after being outdoors where ticks live.  Watch for symptoms of disease if a tick attached to you or if you suspect a tick bite. How can I prevent tick bites? Take these steps to help prevent tick bites when you go outdoors in an area where ticks live: Use insect repellent  Use insect repellent that has DEET (20% or higher), picaridin, or IR3535 in it. Follow the instructions on the label. Use these products on: ? Bare skin. ? The top of your boots. ? Your pant legs. ? Your sleeve cuffs.  For insect repellent that contains permethrin, follow the instructions on the label. Use these products on: ? Clothing. ? Boots. ? Outdoor gear. ? Tents. When you are outside  Wear protective clothing. Long sleeves and long pants offer the best protection from ticks.  Wear light-colored clothing so you can see ticks more easily.  Tuck your pant legs into your socks.  If you go walking on a trail, stay in the middle of the trail so your skin, hair, and clothing do not touch the bushes.  Avoid walking through areas with long grass.  Check for ticks on your clothing, hair, and skin  often while you are outside, and check again before you go inside. Make sure to check the scalp, neck, armpits, waist, groin, and joint areas. These are the spots where ticks attach themselves most often. When you go indoors  Check your clothing for ticks. Tumble dry clothes in a dryer on high heat for at least 10 minutes. If clothes are damp, additional time may be needed. If clothes require washing, use hot water.  Examine gear and pets.  Shower soon after being outdoors.  Check your body for ticks. Conduct a full body check using a mirror. What is the proper way to remove a tick? If you find a tick on your body, remove it as soon as possible. Removing a tick sooner can prevent germs from passing to your body. Do not remove the tick with your bare fingers. To remove a tick that is crawling on your skin but has not bitten, use either of these methods:  Go outdoors and brush the tick off.  Remove the tick with tape or a lint roller. To remove a tick that is attached to your skin: 1. Wash your hands. If you have latex gloves, put them on. 2. Use fine-tipped tweezers, curved forceps, or a tick-removal tool to gently grasp the tick as close to your skin and the tick's head as possible. 3. Gently pull with a steady, upward, even pressure until the tick lets go.  4. When removing the tick: ? Take care to keep the tick's head attached to its body. ? Do not twist or jerk the tick. This can make the tick's head or mouth parts break off and remain in the skin. ? Do not squeeze or crush the tick's body. This could force disease-carrying fluids from the tick into your body. Do not try to remove a tick with heat, alcohol, petroleum jelly, or fingernail polish. Using these methods can cause the tick to salivate and regurgitate into your bloodstream, increasing your risk of getting a disease.   What should I do after removing a tick?  Dispose of the tick. Do not crush a tick with your fingers.  Clean  the bite area and your hands with soap and water, rubbing alcohol, or an iodine scrub.  If an antiseptic cream or ointment is available, apply a small amount to the bite site.  Wash and disinfect any instruments that you used to remove the tick. How should I dispose of a tick? To dispose of a live tick, use one of these methods:  Place it in rubbing alcohol.  Place it in a sealed bag or container.  Wrap it tightly in tape.  Flush it down the toilet. Contact a health care provider if:  You have symptoms of a disease after a tick bite. Symptoms of a tick-borne disease can occur from moments after the tick bites to 30 days after a tick is removed. Symptoms include: ? Fever or chills. ? Any of these signs in the bite area:  A red rash that makes a circle (bull's-eye rash) in the bite area.  Redness and swelling. ? Headache. ? Muscle, joint, or bone pain. ? Abnormal tiredness. ? Numbness in your legs or difficulty walking or moving your legs. ? Tender, swollen lymph glands.  A part of a tick breaks off and gets stuck in your skin. Get help right away if:  You are not able to remove a tick.  You experience muscle weakness or paralysis.  Your symptoms get worse or you experience new symptoms.  You find an engorged tick on your skin and you are in an area where disease from ticks is a high risk. Summary  Ticks may carry germs that can spread to a person through a bite and cause a disease.  Wear protective clothing and use insect repellent to prevent tick bites. Follow the instructions on the label.  If you find a tick on your body, remove it as soon as possible. If the tick is attached, do not try to remove with heat, alcohol, petroleum jelly, or fingernail polish.  Remove the attached tick using fine-tipped tweezers, curved forceps, or a tick-removal tool. Gently pull with steady, upward, even pressure until the tick lets go. Do not twist or jerk the tick. Do not squeeze or  crush the tick's body.  If you have symptoms of a disease after being bitten by a tick, contact a health care provider. This information is not intended to replace advice given to you by your health care provider. Make sure you discuss any questions you have with your health care provider. Document Revised: 07/13/2019 Document Reviewed: 07/13/2019 Elsevier Patient Education  2021 [employer].

## 2020-12-06 NOTE — Progress Notes (Addendum)
Chief Complaint  Patient presents with  . Follow-up   Annual doing well  1. MS stable f/u Dr. Felecia Shelling on [facility] 2. Tick bite right lower back with itching 2-3 weeks ago  3. hld on lipitor 10 mg qd sl elevated lfts will repeat  4. Sun damage changes to face referred dermatology   Review of Systems  Constitutional: Negative for weight loss.  HENT: Negative for hearing loss.   Eyes: Negative for blurred vision.  Respiratory: Negative for shortness of breath.   Cardiovascular: Negative for chest pain.  Gastrointestinal: Negative for abdominal pain.  Musculoskeletal: Negative for falls.  Skin: Negative for rash.  Neurological: Negative for dizziness and headaches.  Psychiatric/Behavioral: Negative for depression.   Past Medical History:  Diagnosis Date  . Basal cell carcinoma    forehead Dr. Evorn Gong in 2019   . Kidney stones    age 55  . Multiple sclerosis ([facility])    mild sxs in hand and left eye Dr. Kerman Passey neurology  . Vision abnormalities    Past Surgical History:  Procedure Laterality Date  . CYSTOSCOPY WITH STENT PLACEMENT    . LITHOTRIPSY     age 63 y.o   . VASECTOMY     Family History  Problem Relation Age of Onset  . Hypertension Mother   . Hypertension Father   . Ankylosing spondylitis Son        age 67    Social History   Socioeconomic History  . Marital status: Married    Spouse name: Not on file  . Number of children: Not on file  . Years of education: Not on file  . Highest education level: Not on file  Occupational History  . Not on file  Tobacco Use  . Smoking status: Never Smoker  . Smokeless tobacco: Never Used  Vaping Use  . Vaping Use: Never used  Substance and Sexual Activity  . Alcohol use: Yes    Comment: "maybe once a month"  . Drug use: Not Currently  . Sexual activity: Yes    Partners: Female  Other Topics Concern  . 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 atorvastatin (LIPITOR) 10 MG tablet Take 1 tablet (10 mg total) by mouth at bedtime.  . cholecalciferol (VITAMIN D) 1000 UNITS tablet Take 1,000 Units by mouth daily.  . clobetasol cream (TEMOVATE) 2.54 % Apply 1 application topically 2 (two) times daily. Right lower back itching s/p tick bite  . Diroximel Fumarate (VUMERITY) 231 MG CPDR TAKE 2 CAPSULES BY MOUTH 2 TIMES A DAY.  [name] doxycycline (VIBRA-TABS) 100 MG tablet Take 1 tablet (100 mg total) by mouth 2 (two) times daily. With food  . Tdap (BOOSTRIX) 5-2.5-18.5 LF-MCG/0.5 injection Inject 0.5 mLs into the muscle once for 1 dose.  [name] Zoster Vaccine Adjuvanted (  SHINGRIX) injection Inject 0.5 mLs into the muscle once for 1 dose.  [name] Zoster Vaccine Adjuvanted [facility]) injection Inject 0.5 mLs into the muscle once for 1 dose.   No Known Allergies No results found for this or any previous visit (from the past 2160 hour(s)). Objective  Body mass index is 31.3 kg/m. Wt Readings from Last 3 Encounters:  12/06/20 224 lb 6.4 oz (101.8 kg)  06/29/20 219 lb (99.3 kg)  04/07/20 215 lb (97.5 kg)   Temp Readings from Last 3 Encounters:  12/06/20 (!) 97.4 F (36.3 C) (Oral)  12/03/19 (!) 97.3 F (36.3 C) (Temporal)  04/14/19 98.2 F (36.8 C)   BP Readings from Last 3 Encounters:  12/06/20 112/76  06/29/20 125/70  12/29/19 (!) 142/83   Pulse Readings from Last 3 Encounters:  12/06/20 69  06/29/20 79  12/29/19 71    Physical Exam Vitals and nursing note reviewed.  Constitutional:       Appearance: Normal appearance. He is well-developed and well-groomed. He is obese.  HENT:     Head: Normocephalic and atraumatic.  Eyes:     Conjunctiva/sclera: Conjunctivae normal.     Pupils: Pupils are equal, round, and reactive to light.  Cardiovascular:     Rate and Rhythm: Normal rate and regular rhythm.     Heart sounds: Normal heart sounds. No murmur heard.   Pulmonary:     Effort: Pulmonary effort is normal.     Breath sounds: Normal breath sounds.  Skin:    General: Skin is warm and dry.  Neurological:     General: No focal deficit present.     Mental Status: He is alert and oriented to person, place, and time. Mental status is at baseline.     Gait: Gait normal.  Psychiatric:        Attention and Perception: Attention and perception normal.        Mood and Affect: Mood and affect normal.        Speech: Speech normal.        Behavior: Behavior normal. Behavior is cooperative.        Thought Content: Thought content normal.        Cognition and Memory: Cognition and memory normal.        Judgment: Judgment normal.     Assessment  Plan  Annual physical exam - Plan: Comprehensive metabolic panel, Lipid panel, TSH, CBC with Differential/Platelet Flu shot never had  tdaprx sent to pharmacy Disc shingrix x 2 dosesconsider in future Rx parmacy  covid vx 3/3utd moderna disc booster  Colonoscopy disc todayleb GI referred  Labs reviewed12/2021 Normal PSA 0.83 12/01/19 declines had urology Dr. Farrel Gordon established  PSA had 2022 Dr. Yves Dill 10/26/2020 get results obtained 0.7 10/16/20   Skin Dr. Evorn Gong h/o BCCreferred rec healthy diet and exercise   Hyperlipidemia, unspecified hyperlipidemia type - Plan: Comprehensive metabolic panel, Lipid panel, CBC with Differential/Platelet lipitor 10 mg qhs   Tick bite of right lower back, initial encounter - Plan: doxycycline (VIBRA-TABS) 100 MG tablet, clobetasol cream (TEMOVATE) 0.05 %  Multiple sclerosis ([place]) stable  F/u  neurology    Provider: Dr. Olivia Mackie McLean-Scocuzza-[facility]

## 2020-12-15 ENCOUNTER — Other Ambulatory Visit (INDEPENDENT_AMBULATORY_CARE_PROVIDER_SITE_OTHER): Payer: Managed Care, Other (non HMO)

## 2020-12-15 ENCOUNTER — Other Ambulatory Visit: Payer: Self-pay

## 2020-12-15 DIAGNOSIS — Z0000 Encounter for general adult medical examination without abnormal findings: Secondary | ICD-10-CM | POA: Diagnosis not present

## 2020-12-15 DIAGNOSIS — Z1329 Encounter for screening for other suspected endocrine disorder: Secondary | ICD-10-CM

## 2020-12-15 DIAGNOSIS — E785 Hyperlipidemia, unspecified: Secondary | ICD-10-CM | POA: Diagnosis not present

## 2020-12-15 DIAGNOSIS — W57XXXA Bitten or stung by nonvenomous insect and other nonvenomous arthropods, initial encounter: Secondary | ICD-10-CM

## 2020-12-15 DIAGNOSIS — G35 Multiple sclerosis: Secondary | ICD-10-CM | POA: Diagnosis not present

## 2020-12-15 DIAGNOSIS — S30860A Insect bite (nonvenomous) of lower back and pelvis, initial encounter: Secondary | ICD-10-CM

## 2020-12-15 LAB — LIPID PANEL
Cholesterol: 178 mg/dL (ref 0–200)
HDL: 44.9 mg/dL (ref >39.00)
LDL Cholesterol: 117 mg/dL — ABNORMAL HIGH (ref 0–99)
NonHDL: 132.81
Total CHOL/HDL Ratio: 4
Triglycerides: 79 mg/dL (ref 0.0–149.0)
VLDL: 15.8 mg/dL (ref 0.0–40.0)

## 2020-12-15 LAB — CBC WITH DIFFERENTIAL/PLATELET
Basophils Absolute: 0 10*3/uL (ref 0.0–0.1)
Basophils Relative: 0.7 % (ref 0.0–3.0)
Eosinophils Absolute: 0.1 10*3/uL (ref 0.0–0.7)
Eosinophils Relative: 2.3 % (ref 0.0–5.0)
HCT: 43.2 % (ref 39.0–52.0)
Hemoglobin: 14.9 g/dL (ref 13.0–17.0)
Lymphocytes Relative: 21.4 % (ref 12.0–46.0)
Lymphs Abs: 1.2 10*3/uL (ref 0.7–4.0)
MCHC: 34.4 g/dL (ref 30.0–36.0)
MCV: 83.2 fl (ref 78.0–100.0)
Monocytes Absolute: 0.4 10*3/uL (ref 0.1–1.0)
Monocytes Relative: 7.7 % (ref 3.0–12.0)
Neutro Abs: 3.8 10*3/uL (ref 1.4–7.7)
Neutrophils Relative %: 67.9 % (ref 43.0–77.0)
Platelets: 147 10*3/uL — ABNORMAL LOW (ref 150.0–400.0)
RBC: 5.19 Mil/uL (ref 4.22–5.81)
RDW: 13.2 % (ref 11.5–15.5)
WBC: 5.6 10*3/uL (ref 4.0–10.5)

## 2020-12-15 LAB — COMPREHENSIVE METABOLIC PANEL WITH GFR
ALT: 31 U/L (ref 0–53)
AST: 19 U/L (ref 0–37)
Albumin: 4.5 g/dL (ref 3.5–5.2)
Alkaline Phosphatase: 60 U/L (ref 39–117)
BUN: 16 mg/dL (ref 6–23)
CO2: 26 meq/L (ref 19–32)
Calcium: 9.4 mg/dL (ref 8.4–10.5)
Chloride: 107 meq/L (ref 96–112)
Creatinine, Ser: 1.02 mg/dL (ref 0.40–1.50)
GFR: 82.56 mL/min
Glucose, Bld: 96 mg/dL (ref 70–99)
Potassium: 4 meq/L (ref 3.5–5.1)
Sodium: 140 meq/L (ref 135–145)
Total Bilirubin: 0.7 mg/dL (ref 0.2–1.2)
Total Protein: 7.3 g/dL (ref 6.0–8.3)

## 2020-12-15 LAB — TSH: TSH: 1.92 u[IU]/mL (ref 0.35–4.50)

## 2020-12-16 ENCOUNTER — Telehealth: Payer: Self-pay

## 2020-12-16 DIAGNOSIS — E785 Hyperlipidemia, unspecified: Secondary | ICD-10-CM

## 2020-12-16 NOTE — Telephone Encounter (Signed)
CBC w/ Diff has been ordered for future labs

## 2020-12-27 NOTE — Progress Notes (Signed)
PATIENT: Perry Gentry DOB: 1965/08/16  REASON FOR VISIT: follow up HISTORY FROM: patient  Chief Complaint  Patient presents with  . Follow-up    RM 2, alone. Pt states he is stable with some occasional increase in numbness in hands.      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 He continues Vumerity and tolerates well. Last MRI stable 2019, new MRI ordered 06/2020 but not performed yet. Labs have been stable. Platelets were low (147) with recent CPE and has orders to repeat with PCP, lymphocytes normal.   He is doing well, today. No new or exacerbating symptoms. He has noticed waxing and waning numbness in bilateral hands. He wakes from sleep occasionally with his hands feeling like they are tingling. Symptoms improve once he moves around. NCS in 2019 showed left C7 radiculopathy and carpal tunnel.   Otherwise he is doing well. He feels mood is stable. He is sleeping well. No changes in vision or bowel/bladder habits.    06/29/2020 RS: Perry Gentry is a right handed 55 y.o. man with MS diagnosed in 2014.    Update 06/29/2020: He is on Vumerity and he tolerates it well (Tecfidera and DMF had high co-pays).   He denies any difficulty with gait, balance, strength . He had had some numbness and has borderline CTS.   He sees urology for mild BPH and urianry hesitancy.  No incontinence.   He is on saw palmetto.    Vision is doing well.  He does not note any issues with fatigue though he is less active since Covid-19.   He is no longer exercising.  Cognition is doing well.   He has mild depression.  Pristiq had not helped and he stopped.       MS History:     In his 34s, he had visual changes in the left eye where he felt that was a veil over his vision.   Colors are still desaturated out of the left eye.   In 2010 he noted that when he would bend his neck forward he would have a zinging sensation going down from the spine to both  of his hands. Over a couple years this improved.  He was noted numbness in his hands since the middle of 2014. Sometimes, he would drop items.  There was not any pain nor any weakness.  [name] He saw orthopedics for his hand numbness in 2014 and had an MRI of the cervical spine. The MRI showed plaque at the C2-C3 region consistent with multiple sclerosis. It also showed degenerative changes at C6-C7 with severe bilateral foraminal stenosis. A subsequent MRI of the brain showed several white matter lesions (2 periventricular, 1-2 cortical foci)  consistent with multiple sclerosis. No abnormal enhancement.   He was referred to Dr. Manuella Ghazi in [place] who diagnosed him with multiple sclerosis.   He was started on Tecfidera.   STUDIES MRI of the brain 08/05/2016 shows a few  T2/FLAIR hyperintense foci in the  white matter, most nonspecific.  [name]  MRI of the cervical spine 2015 showed a spinal cord plaque at C3-C4 more to the left and also has degenerative changes to the right C3-C4 and bilaterally at C6-C7.  MRI brain 11/26/17 showed several T2/FLAIR hyperintense foci in the white matter of the hemispheres. This is a nonspecific finding and could be due to chronic demyelinating plaque associated with multiple sclerosis but could also be due to chronic microvascular ischemic  change. None of the foci appears to be acute.There is a normal enhancement pattern and there are no acute findings.   MRI cervical spine 11/26/2017 showed abnormal signal within the spinal cord adjacent to C3-C4. This is unchanged when compared to the 11/03/2013 MRI Multilevel degenerative changes as detailed above. At C3-C4, there is moderately severe right foraminal narrowing that could lead to right C4 nerve root compression. At C6-C7, there is borderline spinal stenosis and moderately severe left foraminal narrowing that could lead to left C7 nerve root compression.  NCV/EMG 12/31/2017 showsMild left chronic C7 radiculopathy without  active features.Borderline carpal tunnel syndromes   12/29/2019 ALL:  Missael Gentry is a 55 y.o. male here today for follow up for RRMS. Tecfidera switched to Vumerity due cost of generic Tecfidera. He continues to tolerate Vumerity. He is feeling well today. Mood is stable. He discontinued Pristiq as he didn't feel that he needed this medication. He has not seen urology recently. He continues to have urgency but feels it is stable. Saw palmetto may have helped some but he is not currently taking this.   He was seen in 11/2019 for CPE. Labs were unremarkable with exception of elevated lipids. He was started on atorvastatin 10mg  and is tolerating it well. He admits that he has a sedentary lifestyle.   He denies vision or gait changes. No new numbness or weakness.    HISTORY: (copied from Dr Garth Bigness note on 04/14/2019)  Perry Gentry is a right handed 55 y.o. man with MS diagnosed in 2014.    Update 04/14/2019: He is on Tecfidera and he tolerates it well.   He denies any difficulty with gait, balance, strength .   He sees urology and has mild BPH and hesitancy.   He is on saw palmetto.   He gets some urgency.      He denies any problems with fatigue though he has been a little less active.     Cognition is doing well.   At the last visit, Pristiq was started for mild depression.   He is not sure it has helped any and would prefer to stop.     Update 08/15/18: He feels his MS is stable.   He is on Tecfidera and tolerates it well.     He has numbness in his hands doing about the same as last visit.  The quality is loss of sensation and mild tingling but not pain.   He feels the intensity is the same.   It does not wake him up.   His handwriting has not changed.   He feels some tactile issues in his hands though.   This started a few years ago in fingertips and now involves all the fingers up to the palms.     From 12/31/2017 NCV/EMG IMPRESSION This NCV/EMG study shows the  following: 1.Mild left chronic C7 radiculopathy without active features 2.Borderline carpal tunnel syndromes  His gait is good and no falls or stumbles.   No weakness.   He sees urologist due to a large prostate and saw palmetto was prescribed.    He has urinary urgency but not retention/hesitancy.   He notes fatigue and also has some apathy.  He feels mildly depressed. His wife tells him he gets angry too quickly.  He tried Wellbutrin in the past and did not feel well on it so stopped.      Update 11/13/2017: He feels his MS has been mostly stable though he is noticed some more  numbness in his hands.  He has been on Tecfidera and tolerates it well.  He is not having any problems with flushing or GI upset.   The numbness and tingling has been present since the onset of symptoms but has slowly progressed toinclude the entire hand bilaterally.       I personally reviewed the MRI of the brain dated 08/05/2016.  It shows a few  T2/FLAIR hyperintense foci in the   white matter, most nonspecific.  [name]  An MRI of the cervical spine in 2015 did show that he had a spinal cord plaque at C3-C4 more to the left and also has degenerative changes to the right C3-C4 and bilaterally at C6-C7.  In his 20's he had optic neuritis (left) and then the hand numbness in 2015.    From 07/06/2016: MS:   He is on Tecfidera and he tolerates Tecfidera well and denies significant flushing or GI upset.    He has some flushing if he takes on an empty stomach.  He denies any definite exacerbation but has noted more numbness in the hands and palms.   Hand Numbness:   He reports bilateral hand numbness, mostly in the middle three fingertips..    He does not have Phalen's or Tinel's sings but has awoken a few times with numbness in his hands, once severe enough that he could not move his wrist x several minutes.   MRI of the cervical spine 2015 [place] shows a spinal cord plaque at C3-C4 that is central and slightly to  the left.  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 Every once and awhile when he turns fast he feels slightly off balanced.   He lifts weights once or twice a week.   He does not note any imbalance with his eyes closed. He has noted numbness that was only in the fingertips last year is now  Involving the hands.   His hands are also sometimes stiff.    Tingling does not change depending on position.   No grip weakness.  He denies any weakness or numbness in his hands or legs.   Bladder/bowel: Bowel function is fine.   Prostate is enlarged and he sometimes has urinary urgency.   Ho hesitancy .  He has noted decreased libido and ED.    PSA has been fine  Vision:    Since his mid 86s, he has had decreased color saturation and a Vf defect on the left.   Right eye is fine  Fatigue/sleep: He feels energy is good most days.    He feels that he sleeps well most nights but not every night. Some nights he falls asleep but then wakes up and has trouble going back to sleep.   He is not always refreshed in the morning.    He snores some and he reports his wife has never commented to him about pauses or gasping at night.    He occasionally takes a 10 mg doxepin.     Mood/cognition: He feels mood is doing a little better.,    Buproprion had no effect so he stopped.    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   MS History:    In his 37s, he had visual changes in the left eye where he felt  that was a veil over his vision.   Colors are still desaturated out of the left eye.   In 2010 he noted that when he would bend his neck forward he would have a zinging sensation going down from the spine to both of his hands. Over a couple years this improved.  He was noted numbness in his hands since the middle of 2014. Sometimes, he would  drop items.  There was not any pain nor any weakness.  [name] He saw orthopedics for his hand numbness in 2014 and had an MRI of the cervical spine. The MRI showed plaque at the C2-C3 region consistent with multiple sclerosis. It also showed degenerative changes at C6-C7 with severe bilateral foraminal stenosis. A subsequent MRI of the brain showed several white matter lesions (2 periventricular, 1-2 cortical foci)  consistent with multiple sclerosis. No abnormal enhancement.   He was referred to Dr. Manuella Ghazi in [place] who diagnosed him with multiple sclerosis.   He was started on Tecfidera.    REVIEW OF SYSTEMS: Out of a complete 14 system review of symptoms, the patient complains only of the following symptoms,intermittent numbness of hands and all other reviewed systems are negative.   ALLERGIES: No Known Allergies  HOME MEDICATIONS: Outpatient Medications Prior to Visit  Medication Sig Dispense Refill  . atorvastatin (LIPITOR) 10 MG tablet Take 1 tablet (10 mg total) by mouth at bedtime. 90 tablet 3  . cholecalciferol (VITAMIN D) 1000 UNITS tablet Take 1,000 Units by mouth daily.    . Diroximel Fumarate (VUMERITY) 231 MG CPDR TAKE 2 CAPSULES BY MOUTH 2 TIMES A DAY. 360 capsule 3  . clobetasol cream (TEMOVATE) 0.27 % Apply 1 application topically 2 (two) times daily. Right lower back itching s/p tick bite 30 g 0  . doxycycline (VIBRA-TABS) 100 MG tablet Take 1 tablet (100 mg total) by mouth 2 (two) times daily. With food 14 tablet 0   No facility-administered medications prior to visit.    PAST MEDICAL HISTORY: Past Medical History:  Diagnosis Date  . Basal cell carcinoma    forehead Dr. Evorn Gong in 2019   . Kidney stones    age 25  . Multiple sclerosis (HCC)    mild sxs in hand and left eye Dr. Kerman Passey neurology  . Vision abnormalities     PAST SURGICAL HISTORY: Past Surgical History:  Procedure Laterality Date  . CYSTOSCOPY WITH STENT PLACEMENT    . LITHOTRIPSY     age 58 y.o   .  VASECTOMY      FAMILY HISTORY: Family History  Problem Relation Age of Onset  . Hypertension Mother   . Hypertension Father   . Ankylosing spondylitis Son        age 11     SOCIAL HISTORY: Social History   Socioeconomic History  . Marital status: Married    Spouse name: Not on file  . Number of children: Not on file  . Years of education: Not on file  . Highest education level: Not on file  Occupational History  . Not on file  Tobacco Use  . Smoking status: Never Smoker  . Smokeless tobacco: Never Used  Vaping Use  . Vaping Use: Never used  Substance and Sexual Activity  . Alcohol use: Yes    Comment: "maybe once a month"  . Drug use: Not Currently  . Sexual activity: Yes    Partners: Female  Other Topics Concern  . 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  Generalized: Well developed, in no acute distress  Cardiology: normal rate and rhythm, no murmur noted Respiratory: clear to auscultation bilaterally  Neurological examination  Mentation: Alert oriented to time, place, history taking. Follows all commands speech and language fluent Cranial nerve II-XII: Pupils were equal round reactive to light. Extraocular movements were full, visual field were full on confrontational test. Facial sensation and strength were  normal. Head turning and shoulder shrug  were normal and symmetric. Motor: The motor testing reveals 5 over 5 strength of all 4 extremities. Good symmetric motor tone is noted throughout.  Sensory: Sensory testing is intact to soft touch on all 4 extremities. No evidence of extinction is noted.  Coordination: Cerebellar testing reveals good finger-nose-finger and heel-to-shin bilaterally.  Gait and station: Gait is normal.  Reflexes: Deep tendon reflexes are symmetric and normal bilaterally.   DIAGNOSTIC DATA (LABS, IMAGING, TESTING) - I reviewed patient records, labs, notes, testing and imaging myself where available.  No flowsheet data found.   Lab Results  Component Value Date   WBC 5.6 12/15/2020   HGB 14.9 12/15/2020   HCT 43.2 12/15/2020   MCV 83.2 12/15/2020   PLT 147.0 (L) 12/15/2020      Component Value Date/Time   NA 140 12/15/2020 0759   NA 143 06/29/2020 1111   K 4.0 12/15/2020 0759   CL 107 12/15/2020 0759   CO2 26 12/15/2020 0759   GLUCOSE 96 12/15/2020 0759   BUN 16 12/15/2020 0759   BUN 13 06/29/2020 1111   CREATININE 1.02 12/15/2020 0759   CALCIUM 9.4 12/15/2020 0759   PROT 7.3 12/15/2020 0759   PROT 7.1 06/29/2020 1111   ALBUMIN 4.5 12/15/2020 0759   ALBUMIN 4.5 06/29/2020 1111   AST 19 12/15/2020 0759   ALT 31 12/15/2020 0759   ALKPHOS 60 12/15/2020 0759   BILITOT 0.7 12/15/2020 0759   BILITOT 0.5 06/29/2020 1111   GFRNONAA 82 06/29/2020 1111   GFRAA 95 06/29/2020 1111   Lab Results  Component Value Date   CHOL 178 12/15/2020   HDL 44.90 12/15/2020   LDLCALC 117 (H) 12/15/2020   TRIG 79.0 12/15/2020   CHOLHDL 4 12/15/2020   No results found for: HGBA1C No results found for: VITAMINB12 Lab Results  Component Value Date   TSH 1.92 12/15/2020       ASSESSMENT AND PLAN 55 y.o. year old male  has a past medical history of Basal cell carcinoma, Kidney stones, Multiple sclerosis ([place]), and Vision abnormalities. here with     ICD-10-CM   1.  Relapsing remitting multiple sclerosis ([place])  G35   2. High risk medication use  Z79.899   3. Other fatigue  R53.83   4. Depression, unspecified depression type  F32.A      He continues to do well on Vumerity. We will continue current therapy. He will check cost of MRI with insurance as he is concerned about cost. Labs stable with PCP. He will continue close follow up with PCP for cholesterol management. Depression is stable. I have encouraged him to consider healthy lifestyle changes with heart healthy diet and regular exercise. He will follow up with [place] in 6 months, sooner if needed. He verbalizes understanding and agreement with this plan.    No orders of the defined types were placed in this encounter.    No orders of the defined types were placed in this encounter.      Debbora Presto, FNP-C 12/28/2020, 8:11 AM [facility] [address], [place], [place] [address] [phone]

## 2020-12-27 NOTE — Patient Instructions (Signed)
Below is our plan:  We will continue current treatment plan. Call regarding MRI and let us know if you need anything.   Please make sure you are staying well hydrated. I recommend 50-60 ounces daily. Well balanced diet and regular exercise encouraged. Consistent sleep schedule with 6-8 hours recommended.   Please continue follow up with care team as directed.   Follow up with Dr Felecia Shelling in 6 months   You may receive a survey regarding today's visit. I encourage you to leave honest feed back as I do use this information to improve patient care. Thank you for seeing me today!      Multiple Sclerosis Multiple sclerosis (MS) is a disease of the brain, spinal cord, and optic nerves (central nervous system). It causes the body's disease-fighting (immune) system to destroy the protective covering (myelin sheath) around nerves in the brain. When this happens, signals (nerve impulses) going to and from the brain and spinal cord do not get sent properly or may not get sent at all. There are several types of MS:  Relapsing-remitting MS. This is the most common type. This causes sudden attacks of symptoms. After an attack, you may recover completely until the next attack, or some symptoms may remain permanently.  Secondary progressive MS. This usually develops after the onset of relapsing-remitting MS. Similar to relapsing-remitting MS, this type also causes sudden attacks of symptoms. Attacks may be less frequent, but symptoms slowly get worse (progress) over time.  Primary progressive MS. This causes symptoms that steadily progress over time. This type of MS does not cause sudden attacks of symptoms. The age of onset of MS varies, but it often develops between 67-46 years of age. MS is a lifelong (chronic) condition. There is no cure, but treatment can help slow down the progression of the disease. What are the causes? The cause of this condition is not known. What increases the risk? You are more  likely to develop this condition if:  You are a woman.  You have a relative with MS. However, the condition is not passed from parent to child (inherited).  You have a lack (deficiency) of vitamin D.  You smoke. MS is more common in the [place] than in the [place]. What are the signs or symptoms? Relapsing-remitting and secondary progressive MS cause symptoms to occur in episodes or attacks that may last weeks to months. There may be long periods between attacks in which there are almost no symptoms. Primary progressive MS causes symptoms to steadily progress after they develop. Symptoms of MS vary because of the many different ways it affects the central nervous system. The main symptoms include:  Vision problems and eye pain.  Numbness and weakness.  Inability to move your arms, hands, feet, or legs (paralysis).  Balance problems.  Shaking that you cannot control (tremors).  Muscle spasms.  Problems with thinking (cognitive changes). MS can also cause symptoms that are associated with the disease, but are not always the direct result of an MS attack. They may include:  Inability to control urination or bowel movements (incontinence).  Headaches.  Fatigue.  Inability to tolerate heat.  Emotional changes.  Depression.  Pain. How is this diagnosed? This condition is diagnosed based on:  Your symptoms.  A neurological exam. This involves checking central nervous system function, such as nerve function, reflexes, and coordination.  MRIs of the brain and spinal cord.  Lab tests, including a lumbar puncture that tests the fluid that surrounds  the brain and spinal cord (cerebrospinal fluid).  Tests to measure the electrical activity of the brain in response to stimulation (evoked potentials). How is this treated? There is no cure for MS, but medicines can help decrease the number and frequency of attacks and help relieve nuisance  symptoms. Treatment options may include:  Medicines that reduce the frequency of attacks. These medicines may be given by injection, by mouth (orally), or through an IV.  Medicines that reduce inflammation (steroids). These may provide short-term relief of symptoms.  Medicines to help control pain, depression, fatigue, or incontinence.  Nutritional counseling. Vitamin D supplements, if you have a deficiency.  Using devices to help you move around (assistive devices), such as braces, a cane, or a walker.  Physical therapy to strengthen and stretch your muscles.  Occupational therapy to help you with everyday tasks.  Alternative or complementary treatments such as exercise, massage, or acupuncture.   Follow these instructions at home:  Take over-the-counter and prescription medicines only as told by your health care provider.  Do not drive or use heavy machinery while taking prescription pain medicine.  Use assistive devices as recommended by your physical therapist or your health care provider.  Exercise as directed by your health care provider.  Eating healthy can help manage MS symptoms.  Return to your normal activities as told by your health care provider. Ask your health care provider what activities are safe for you.  Reach out for support. Share your feelings with friends, family, or a support group.  Keep all follow-up visits as told by your health care provider and therapists. This is important. Where to find more information  National Multiple Sclerosis Society: https://www.nationalmssociety.[place] of Neurological Disorders and Stroke: http://hendricks-barton.net/  [employer] Integrative [employer]: GourmetRating.dk Contact a health care provider if:  You feel depressed.  You develop new pain or numbness.  You have tremors.  You have problems with sexual function. Get help right away if:  You develop  paralysis.  You develop numbness.  You have problems with your bladder or bowel function.  You develop double vision.  You lose vision in one or both eyes.  You develop suicidal thoughts.  You develop severe confusion. If you ever feel like you may hurt yourself or others, or have thoughts about taking your own life, get help right away. You can go to your nearest emergency department or call:  Your local emergency services (911 in the [place]  A suicide crisis helpline, such as the [place] at (640)134-3407. This is open 24 hours a day. Summary  Multiple sclerosis (MS) is a disease of the central nervous system that causes the body's immune system to destroy the protective covering (myelin sheath) around nerves in the brain.  There are 3 types of MS: relapsing-remitting, secondary progressive, and primary progressive. Relapsing-remitting and secondary progressive MS cause symptoms to occur in episodes or attacks that may last weeks to months. Primary progressive MS causes symptoms to steadily progress after they develop.  There is no cure for MS, but medicines can help decrease the number and frequency of attacks and help relieve nuisance symptoms. Treatment may also include physical or occupational therapy.  If you develop numbness, paralysis, vision problems, or other neurological symptoms, get help right away. This information is not intended to replace advice given to you by your health care provider. Make sure you discuss any questions you have with your health care provider. Document Revised: 04/26/2020  Document Reviewed: 04/26/2020 Elsevier Patient Education  [place].

## 2020-12-28 ENCOUNTER — Ambulatory Visit: Payer: Managed Care, Other (non HMO) | Admitting: Family Medicine

## 2020-12-28 ENCOUNTER — Encounter: Payer: Self-pay | Admitting: Family Medicine

## 2020-12-28 VITALS — BP 102/70 | HR 76 | Ht 71.0 in | Wt 220.0 lb

## 2020-12-28 DIAGNOSIS — Z79899 Other long term (current) drug therapy: Secondary | ICD-10-CM

## 2020-12-28 DIAGNOSIS — G35 Multiple sclerosis: Secondary | ICD-10-CM

## 2020-12-28 DIAGNOSIS — R5383 Other fatigue: Secondary | ICD-10-CM | POA: Diagnosis not present

## 2020-12-28 DIAGNOSIS — F32A Depression, unspecified: Secondary | ICD-10-CM

## 2020-12-28 NOTE — Progress Notes (Signed)
I have read the note, and I agree with the clinical assessment and plan.  Riko Lumsden A. Milton Streicher, MD, PhD, FAAN Certified in Neurology, Clinical Neurophysiology, Sleep Medicine, Pain Medicine and Neuroimaging  [facility] [address], [address] [place], [place] [address] [phone]  

## 2021-01-04 ENCOUNTER — Other Ambulatory Visit: Payer: Managed Care, Other (non HMO)

## 2021-01-10 ENCOUNTER — Other Ambulatory Visit: Payer: Managed Care, Other (non HMO)

## 2021-01-18 ENCOUNTER — Other Ambulatory Visit: Payer: Self-pay

## 2021-01-18 ENCOUNTER — Other Ambulatory Visit (INDEPENDENT_AMBULATORY_CARE_PROVIDER_SITE_OTHER): Payer: Managed Care, Other (non HMO)

## 2021-01-18 DIAGNOSIS — E785 Hyperlipidemia, unspecified: Secondary | ICD-10-CM

## 2021-01-18 LAB — CBC WITH DIFFERENTIAL/PLATELET
Basophils Absolute: 0 10*3/uL (ref 0.0–0.1)
Basophils Relative: 0.6 % (ref 0.0–3.0)
Eosinophils Absolute: 0.1 10*3/uL (ref 0.0–0.7)
Eosinophils Relative: 2.1 % (ref 0.0–5.0)
HCT: 43.8 % (ref 39.0–52.0)
Hemoglobin: 14.7 g/dL (ref 13.0–17.0)
Lymphocytes Relative: 21.3 % (ref 12.0–46.0)
Lymphs Abs: 1.3 10*3/uL (ref 0.7–4.0)
MCHC: 33.5 g/dL (ref 30.0–36.0)
MCV: 85.3 fl (ref 78.0–100.0)
Monocytes Absolute: 0.4 10*3/uL (ref 0.1–1.0)
Monocytes Relative: 6.7 % (ref 3.0–12.0)
Neutro Abs: 4.4 10*3/uL (ref 1.4–7.7)
Neutrophils Relative %: 69.3 % (ref 43.0–77.0)
Platelets: 139 10*3/uL — ABNORMAL LOW (ref 150.0–400.0)
RBC: 5.13 Mil/uL (ref 4.22–5.81)
RDW: 13.4 % (ref 11.5–15.5)
WBC: 6.3 10*3/uL (ref 4.0–10.5)

## 2021-01-23 ENCOUNTER — Other Ambulatory Visit: Payer: Self-pay | Admitting: Internal Medicine

## 2021-01-23 DIAGNOSIS — E785 Hyperlipidemia, unspecified: Secondary | ICD-10-CM

## 2021-01-28 ENCOUNTER — Encounter: Payer: Self-pay | Admitting: Internal Medicine

## 2021-01-31 NOTE — Telephone Encounter (Signed)
Perry Glow McLean-Scocuzza, MD  01/24/2021  4:27 PM EDT      Slightly low platelets do you want to see a blood specialists to further workup?   Please advise on lipid levels

## 2021-03-07 ENCOUNTER — Encounter: Payer: Self-pay | Admitting: Internal Medicine

## 2021-03-07 ENCOUNTER — Encounter: Payer: Self-pay | Admitting: Family Medicine

## 2021-03-08 ENCOUNTER — Encounter: Payer: Self-pay | Admitting: Internal Medicine

## 2021-03-08 NOTE — Telephone Encounter (Signed)
Okay to send over the counter Covid list with symptoms questions?

## 2021-06-01 ENCOUNTER — Telehealth: Payer: Self-pay | Admitting: *Deleted

## 2021-06-01 NOTE — Telephone Encounter (Signed)
Submitted PA Vumerity to [facility] via fax at [phone]. Received fax confirmation, waiting on determination.

## 2021-06-12 NOTE — Telephone Encounter (Signed)
Called [facility] and spoke w/ Carloyn Manner. PA approved 06/01/21-06/01/22. PA# [id number]. He will fax approval letter to [place] at [phone].

## 2021-06-29 ENCOUNTER — Ambulatory Visit: Payer: Managed Care, Other (non HMO) | Admitting: Neurology

## 2021-08-09 ENCOUNTER — Ambulatory Visit: Payer: Managed Care, Other (non HMO) | Admitting: Neurology

## 2021-08-09 ENCOUNTER — Encounter: Payer: Self-pay | Admitting: Neurology

## 2021-08-09 VITALS — BP 138/78 | HR 66 | Ht 71.0 in | Wt 217.5 lb

## 2021-08-09 DIAGNOSIS — Z79899 Other long term (current) drug therapy: Secondary | ICD-10-CM | POA: Diagnosis not present

## 2021-08-09 DIAGNOSIS — R200 Anesthesia of skin: Secondary | ICD-10-CM | POA: Diagnosis not present

## 2021-08-09 DIAGNOSIS — G35 Multiple sclerosis: Secondary | ICD-10-CM | POA: Diagnosis not present

## 2021-08-09 NOTE — Progress Notes (Signed)
[facility]  PATIENT: Perry Gentry DOB: [birth year]  REFERRING DOCTOR OR PCP:  [place] Pink SOURCE: patient and records  _________________________________   HISTORICAL  CHIEF COMPLAINT:  Chief Complaint  Patient presents with   Follow-up    Rm 1, alone. Here for 6 month MS f/u, on Vumerity. Pt reports doing well. Has noticed some vision changes. Like he's looking through a vail. Mainly when exercising. Pt has noticed balance issues.     HISTORY OF PRESENT ILLNESS:  Perry Gentry is a right handed 56 y.o. man with MS diagnosed in 2014.    Update 08/09/2021: He is on Vumerity and he tolerates it well (Tecfidera and DMF had high co-pays).   He denies any difficulty with gait, balance, strength .A few times with a turn he felt slightly off balanced for a second.   He sometimes uses the bannister on stairs.   He has numbness and has borderline CTS.     He sees urology for mild BPH and urinary hesitancy.  He has some urgency at times.   No incontinence.   He is on saw palmetto.      He does note more issues with his left eye, especially  with exercising.  Vision is doing well.  He denies much fatigue.   He works as a Gaffer and travels some.   He is no longer exercising.  Cognition is doing well.   He has mild depression.  Pristiq had not helped and he stopped.       MS History:     In his 93s, he had visual changes in the left eye where he felt that was a veil over his vision.   Colors are still desaturated out of the left eye.   In 2010 he noted that when he would bend his neck forward he would have a zinging sensation going down from the spine to both of his hands. Over a couple years this improved.  He was noted numbness in his hands since the middle of 2014. Sometimes, he would drop items.  There was not any pain nor any weakness.  [name] He saw orthopedics for his hand numbness in 2014 and had an MRI of the cervical spine. The MRI showed plaque  at the C2-C3 region consistent with multiple sclerosis. It also showed degenerative changes at C6-C7 with severe bilateral foraminal stenosis. A subsequent MRI of the brain showed several white matter lesions (2 periventricular, 1-2 cortical foci)  consistent with multiple sclerosis. No abnormal enhancement.   He was referred to Dr. Manuella Ghazi in [place] who diagnosed him with multiple sclerosis.   He was started on Tecfidera.   STUDIES MRI of the brain 08/05/2016 shows a few  T2/FLAIR hyperintense foci in the  white matter, most nonspecific.  [name]  MRI of the cervical spine 2015 showed a spinal cord plaque at C3-C4 more to the left and also has degenerative changes to the right C3-C4 and bilaterally at C6-C7.  MRI brain 11/26/17 showed several T2/FLAIR hyperintense foci in the white matter of the hemispheres.  This is a nonspecific finding and could be due to chronic demyelinating plaque associated with multiple sclerosis but could also be due to chronic microvascular ischemic change.  None of the foci appears to be acute.   There is a normal enhancement pattern and there are no acute findings.   MRI cervical spine 11/26/2017 showed abnormal signal within the spinal cord adjacent to C3-C4.  This is unchanged when compared  to the 11/03/2013 MRI     Multilevel degenerative changes as detailed above.  At C3-C4, there is moderately severe right foraminal narrowing that could lead to right C4 nerve root compression.  At C6-C7, there is borderline spinal stenosis and moderately severe left foraminal narrowing that could lead to left C7 nerve root compression.  NCV/EMG 12/31/2017 shows Mild left chronic C7 radiculopathy without active features.    Borderline carpal tunnel syndromes   REVIEW OF SYSTEMS: Constitutional: No fevers, chills, sweats, or change in .   He notes some fatigue Eyes: No visual changes, double vision, eye pain Ear, nose and throat: No hearing loss, ear pain, nasal congestion, sore  throat Cardiovascular: No chest pain, palpitations Respiratory:  No shortness of breath at rest or with exertion.   No wheezes GastrointestinaI: No nausea, vomiting, diarrhea, abdominal pain, fecal incontinence Genitourinary:  No dysuria, urinary retention or frequency.  No nocturia.   He has ED and decreased libido Musculoskeletal:  No neck pain, back pain Integumentary: No rash, pruritus, skin lesions Neurological: as above Psychiatric: as above Endocrine: No palpitations, diaphoresis, change in appetite, change in weigh or increased thirst Hematologic/Lymphatic:  No anemia, purpura, petechiae. Allergic/Immunologic: No itchy/runny eyes, nasal congestion, recent allergic reactions, rashes  ALLERGIES: No Known Allergies  HOME MEDICATIONS:  Current Outpatient Medications:    atorvastatin (LIPITOR) 10 MG tablet, TAKE 1 TABLET BY MOUTH EVERYDAY AT BEDTIME, Disp: 90 tablet, Rfl: 3   cholecalciferol (VITAMIN D) 1000 UNITS tablet, Take 1,000 Units by mouth daily., Disp: , Rfl:    Diroximel Fumarate (VUMERITY) 231 MG CPDR, TAKE 2 CAPSULES BY MOUTH 2 TIMES A DAY., Disp: 360 capsule, Rfl: 3  PAST MEDICAL HISTORY: Past Medical History:  Diagnosis Date   Basal cell carcinoma    forehead Dr. Evorn Gong in 2019    COVID-19    03/05/21   Kidney stones    age 58   Multiple sclerosis ([place])    mild sxs in hand and left eye Dr. Kerman Passey neurology   Vision abnormalities     PAST SURGICAL HISTORY: Past Surgical History:  Procedure Laterality Date   CYSTOSCOPY WITH STENT PLACEMENT     LITHOTRIPSY     age 1 y.o    VASECTOMY      FAMILY HISTORY: Family History  Problem Relation Age of Onset   Hypertension Mother    Hypertension Father    Ankylosing spondylitis Son        age 31     SOCIAL HISTORY:  Social History   Socioeconomic History   Marital status: Married    Spouse name: Not on file   Number of children: Not on file   Years of education: Not on file   Highest education level:  Not on file  Occupational History   Not on file  Tobacco Use   Smoking status: Never   Smokeless tobacco: Never  Vaping Use   Vaping Use: Never used  Substance and Sexual Activity   Alcohol use: Yes    Comment: "maybe once a month"   Drug use: Not Currently   Sexual activity: Yes    Partners: Female  Other Topics Concern   Not on file  Social History Narrative   Lives at home with his wife    Right handed   2 kids 1 son and 1 daughter    DPR wife    Works Medical laboratory scientific officer for [employer]    Exercises 30 min 4 x per week  Name originates from [place]    Born in [place] Strain: Not on [employer] Insecurity: Not on file  Transportation Needs: Not on file  Physical Activity: Not on file  Stress: Not on file  Social Connections: Not on file  Intimate Partner Violence: Not on file     PHYSICAL EXAM  Vitals:   08/09/21 1049  BP: 138/78  Pulse: 66  SpO2: 98%  Weight: 217 lb 8 oz (98.7 kg)  Height: 5\' 11"  (1.803 m)    Body mass index is 30.34 kg/m.   General: The patient is well-developed and well-nourished and in no acute distress   Neurologic Exam  Mental status: The patient is alert and oriented x 3 at the time of the examination. The patient has apparent normal recent and remote memory, with an apparently normal attention span and concentration ability.   Speech is normal.  Cranial nerves: Extraocular movements are full.  He has reduced color vision OS.   Facial strength and sensation is normal.  Trapezius strength is normal no obvious hearing deficits are noted.  Motor:  Muscle bulk is normal.   Muscle tone is normal.  Strength is 5/5.[name]   Sensory: Sensory testing is intact to touch and vibration in the arms and legs.  Coordination: Cerebellar testing reveals good finger-nose-finger and heel-to-shin bilaterally.  Gait and station: Station is normal.  Gait is normal.  Tandem gait is minimally  wide.  Romberg is negative.  Reflexes: Deep tendon reflexes are symmetric and normal bilaterally.        ASSESSMENT AND PLAN  Relapsing remitting multiple sclerosis ([place]) - Plan: CBC with Differential/Platelet, Hepatic function panel, MR BRAIN W WO CONTRAST  High risk medication use - Plan: CBC with Differential/Platelet, Hepatic function panel  Hand numbness - Plan: MR BRAIN W WO CONTRAST   1.    Continue Vumerity.     We will check blood work today including CBC with differential, CMP.  Check an MRI of the brain to determine if he is having any subclinical progression.  If this is occurring, consider a switch to different disease modifying therapy. 2.   Stay active and exercise as tolerated.   3.    He will return to see me in 6 months if stable but call sooner if he has any significant new or worsening neurologic symptoms   Bindi Klomp A. Felecia Shelling, MD, PhD 5/64/3329, 51:88 PM Certified in Neurology, Clinical Neurophysiology, Sleep Medicine, Pain Medicine and Neuroimaging  [facility] [address], [place], [place] [address] [phone]

## 2021-08-10 ENCOUNTER — Telehealth: Payer: Self-pay | Admitting: Neurology

## 2021-08-10 LAB — HEPATIC FUNCTION PANEL
ALT: 38 IU/L (ref 0–44)
AST: 23 IU/L (ref 0–40)
Albumin: 5.3 g/dL — ABNORMAL HIGH (ref 3.8–4.9)
Alkaline Phosphatase: 89 IU/L (ref 44–121)
Bilirubin Total: 0.5 mg/dL (ref 0.0–1.2)
Bilirubin, Direct: 0.14 mg/dL (ref 0.00–0.40)
Total Protein: 7.8 g/dL (ref 6.0–8.5)

## 2021-08-10 LAB — CBC WITH DIFFERENTIAL/PLATELET
Basophils Absolute: 0 10*3/uL (ref 0.0–0.2)
Basos: 1 %
EOS (ABSOLUTE): 0.1 10*3/uL (ref 0.0–0.4)
Eos: 2 %
Hematocrit: 46.1 % (ref 37.5–51.0)
Hemoglobin: 15.7 g/dL (ref 13.0–17.7)
Immature Grans (Abs): 0 10*3/uL (ref 0.0–0.1)
Immature Granulocytes: 0 %
Lymphocytes Absolute: 1.3 10*3/uL (ref 0.7–3.1)
Lymphs: 20 %
MCH: 28.4 pg (ref 26.6–33.0)
MCHC: 34.1 g/dL (ref 31.5–35.7)
MCV: 84 fL (ref 79–97)
Monocytes Absolute: 0.4 10*3/uL (ref 0.1–0.9)
Monocytes: 6 %
Neutrophils Absolute: 4.5 10*3/uL (ref 1.4–7.0)
Neutrophils: 71 %
Platelets: 178 10*3/uL (ref 150–450)
RBC: 5.52 x10E6/uL (ref 4.14–5.80)
RDW: 12.7 % (ref 11.6–15.4)
WBC: 6.3 10*3/uL (ref 3.4–10.8)

## 2021-08-10 NOTE — Telephone Encounter (Signed)
[employer] order sent to GI, they will obtain the auth and reach out to the patient to schedule.

## 2021-09-08 ENCOUNTER — Ambulatory Visit: Payer: Managed Care, Other (non HMO) | Admitting: Internal Medicine

## 2021-10-03 ENCOUNTER — Other Ambulatory Visit: Payer: Managed Care, Other (non HMO)

## 2021-10-20 ENCOUNTER — Other Ambulatory Visit: Payer: Self-pay

## 2021-10-20 ENCOUNTER — Ambulatory Visit: Payer: Managed Care, Other (non HMO) | Admitting: Internal Medicine

## 2021-10-20 ENCOUNTER — Encounter: Payer: Self-pay | Admitting: Internal Medicine

## 2021-10-20 VITALS — BP 124/82 | HR 77 | Temp 98.0°F | Ht 71.0 in | Wt 218.0 lb

## 2021-10-20 DIAGNOSIS — R7989 Other specified abnormal findings of blood chemistry: Secondary | ICD-10-CM

## 2021-10-20 DIAGNOSIS — Z1389 Encounter for screening for other disorder: Secondary | ICD-10-CM

## 2021-10-20 DIAGNOSIS — Z1329 Encounter for screening for other suspected endocrine disorder: Secondary | ICD-10-CM

## 2021-10-20 DIAGNOSIS — D696 Thrombocytopenia, unspecified: Secondary | ICD-10-CM

## 2021-10-20 DIAGNOSIS — Z125 Encounter for screening for malignant neoplasm of prostate: Secondary | ICD-10-CM

## 2021-10-20 DIAGNOSIS — Z0000 Encounter for general adult medical examination without abnormal findings: Secondary | ICD-10-CM

## 2021-10-20 DIAGNOSIS — Z23 Encounter for immunization: Secondary | ICD-10-CM

## 2021-10-20 DIAGNOSIS — E785 Hyperlipidemia, unspecified: Secondary | ICD-10-CM

## 2021-10-20 DIAGNOSIS — Z1211 Encounter for screening for malignant neoplasm of colon: Secondary | ICD-10-CM

## 2021-10-20 MED ORDER — SHINGRIX 50 MCG/0.5ML IM SUSR: 0.5000 mL | Freq: Once | INTRAMUSCULAR | 1 refills | Status: AC

## 2021-10-20 MED ORDER — TETANUS-DIPHTH-ACELL PERTUSSIS 5-2.5-18.5 LF-MCG/0.5 IM SUSP: 0.5000 mL | Freq: Once | INTRAMUSCULAR | 0 refills | Status: AC

## 2021-10-20 MED ORDER — ATORVASTATIN CALCIUM 10 MG PO TABS: ORAL_TABLET | ORAL | 3 refills | Status: DC

## 2021-10-20 NOTE — Progress Notes (Addendum)
Chief Complaint  ?Patient presents with  ? Follow-up  ?  9 mo f/u - Hyperlipidemia. Pt reports feeling well. No concerns at this time.  ? ?Pt received automatic msg to sch CPE but to early due 12/15/21 he will reschedul and do fasting labs before phq 9 score 2 today  ? ? ?Review of Systems  ?Constitutional:  Negative for weight loss.  ?HENT:  Negative for hearing loss.   ?Eyes:  Negative for blurred vision.  ?Respiratory:  Negative for shortness of breath.   ?Cardiovascular:  Negative for chest pain.  ?Gastrointestinal:  Negative for abdominal pain and blood in stool.  ?Musculoskeletal:  Negative for back pain.  ?Skin:  Negative for rash.  ?Neurological:  Negative for headaches.  ?Psychiatric/Behavioral:  Negative for depression.   ?Past Medical History:  ?Diagnosis Date  ? Actinic keratoses   ? face use cream  ? Basal cell carcinoma   ? forehead Dr. Evorn Gong in 2019   ? COVID-19   ? 03/05/21  ? Kidney stones   ? age 55  ? Multiple sclerosis ([place])   ? mild sxs in hand and left eye Dr. Kerman Passey neurology  ? Vision abnormalities   ? ?Past Surgical History:  ?Procedure Laterality Date  ? CYSTOSCOPY WITH STENT PLACEMENT    ? LITHOTRIPSY    ? age 1 y.o   ? VASECTOMY    ? ?Family History  ?Problem Relation Age of Onset  ? Hypertension Mother   ? Hypertension Father   ? Ankylosing spondylitis Son   ?     age 67   ? ?Social History  ? ?Socioeconomic History  ? Marital status: Married  ?  Spouse name: Not on file  ? Number of children: Not on file  ? Years of education: Not on file  ? Highest education level: Not on file  ?Occupational History  ? Not on file  ?Tobacco Use  ? Smoking status: Never  ? Smokeless tobacco: Never  ?Vaping Use  ? Vaping Use: Never used  ?Substance and Sexual Activity  ? Alcohol use: Yes  ?  Comment: "maybe once a month"  ? Drug use: Not Currently  ? Sexual activity: Yes  ?  Partners: Female  ?Other Topics Concern  ? Not on file  ?Social History Narrative  ? Lives at home with his wife   ? Right handed  ?  2 kids 1 son and 1 daughter   ? DPR wife   ? 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 Exercises 30 min 4 x per week   ? Name originates from [place]   ? Born in [place]   ? ?Social Determinants of Health  ? ?Financial Resource Strain: Not on file  ?Food Insecurity: Not on file  ?Transportation Needs: Not on file  ?Physical Activity: Not on file  ?Stress: Not on file  ?Social Connections: Not on file  ?Intimate Partner Violence: Not on file  ? ?Current Meds  ?Medication Sig  ? Tdap (BOOSTRIX) 5-2.5-18.5 LF-MCG/0.5 injection Inject 0.5 mLs into the muscle once for 1 dose.  ? Zoster Vaccine Adjuvanted [facility]) injection Inject 0.5 mLs into the muscle once for 1 dose.  ? ?No Known Allergies ?Recent Results (from the past 2160 hour(s))  ?CBC with Differential/Platelet     Status: None  ? Collection Time: 08/09/21 11:28 AM  ?Result Value Ref Range  ? WBC 6.3 3.4 - 10.8 x10E3/uL  ? RBC 5.52 4.14 - 5.80 x10E6/uL  ? Hemoglobin  15.7 13.0 - 17.7 g/dL  ? Hematocrit 46.1 37.5 - 51.0 %  ? MCV 84 79 - 97 fL  ? MCH 28.4 26.6 - 33.0 pg  ? MCHC 34.1 31.5 - 35.7 g/dL  ? RDW 12.7 11.6 - 15.4 %  ? Platelets 178 150 - 450 x10E3/uL  ? Neutrophils 71 Not Estab. %  ? Lymphs 20 Not Estab. %  ? Monocytes 6 Not Estab. %  ? Eos 2 Not Estab. %  ? Basos 1 Not Estab. %  ? Neutrophils Absolute 4.5 1.4 - 7.0 x10E3/uL  ? Lymphocytes Absolute 1.3 0.7 - 3.1 x10E3/uL  ? Monocytes Absolute 0.4 0.1 - 0.9 x10E3/uL  ? EOS (ABSOLUTE) 0.1 0.0 - 0.4 x10E3/uL  ? Basophils Absolute 0.0 0.0 - 0.2 x10E3/uL  ? Immature Granulocytes 0 Not Estab. %  ? Immature Grans (Abs) 0.0 0.0 - 0.1 x10E3/uL  ?Hepatic function panel     Status: Abnormal  ? Collection Time: 08/09/21 11:28 AM  ?Result Value Ref Range  ? Total Protein 7.8 6.0 - 8.5 g/dL  ? Albumin 5.3 (H) 3.8 - 4.9 g/dL  ? Bilirubin Total 0.5 0.0 - 1.2 mg/dL  ? Bilirubin, Direct 0.14 0.00 - 0.40 mg/dL  ? Alkaline Phosphatase 89 44 - 121 IU/L  ? AST 23 0 - 40 IU/L  ? ALT 38 0 - 44 IU/L  ? ?Objective   ?Body mass index is 30.4 kg/m?. ?Wt Readings from Last 3 Encounters:  ?10/20/21 218 lb (98.9 kg)  ?08/09/21 217 lb 8 oz (98.7 kg)  ?12/28/20 220 lb (99.8 kg)  ? ?Temp Readings from Last 3 Encounters:  ?10/20/21 98 ?F (36.7 ?C) (Oral)  ?12/06/20 (!) 97.4 ?F (36.3 ?C) (Oral)  ?12/03/19 (!) 97.3 ?F (36.3 ?C) (Temporal)  ? ?BP Readings from Last 3 Encounters:  ?10/20/21 124/82  ?08/09/21 138/78  ?12/28/20 102/70  ? ?Pulse Readings from Last 3 Encounters:  ?10/20/21 77  ?08/09/21 66  ?12/28/20 76  ? ? ?Physical Exam ?Vitals and nursing note reviewed.  ?Constitutional:   ?   Appearance: Normal appearance. He is well-developed and well-groomed.  ?HENT:  ?   Head: Normocephalic and atraumatic.  ?Eyes:  ?   Conjunctiva/sclera: Conjunctivae normal.  ?   Pupils: Pupils are equal, round, and reactive to light.  ?Cardiovascular:  ?   Rate and Rhythm: Normal rate and regular rhythm.  ?   Heart sounds: Normal heart sounds.  ?Pulmonary:  ?   Effort: Pulmonary effort is normal. No respiratory distress.  ?   Breath sounds: Normal breath sounds.  ?Abdominal:  ?   Tenderness: There is no abdominal tenderness.  ?Skin: ?   General: Skin is warm and moist.  ?Neurological:  ?   General: No focal deficit present.  ?   Mental Status: He is alert and oriented to person, place, and time. Mental status is at baseline.  ?   Sensory: Sensation is intact.  ?   Motor: Motor function is intact.  ?   Coordination: Coordination is intact.  ?   Gait: Gait is intact. Gait normal.  ?Psychiatric:     ?   Attention and Perception: Attention and perception normal.     ?   Mood and Affect: Mood and affect normal.     ?   Speech: Speech normal.     ?   Behavior: Behavior normal. Behavior is cooperative.     ?   Thought Content: Thought content normal.     ?  Cognition and Memory: Cognition and memory normal.     ?   Judgment: Judgment normal.  ? ? ?Assessment  ?Plan  ?Hyperlipidemia, unspecified hyperlipidemia type - Plan: atorvastatin (LIPITOR) 10 MG  tablet, Lipid panel ? ?Need for Tdap vaccination - Plan: Tdap (BOOSTRIX) 5-2.5-18.5 LF-MCG/0.5 injection ? ?Need for shingles vaccine - Plan: Zoster Vaccine Adjuvanted [facility]) injection ? ?Encounter for screening colonoscopy - Plan: Ambulatory referral to Gastroenterology ? ?Thrombocytopenia ([place]) - Plan: CBC with Differential/Platelet, Pathologist smear review ? ?Annual physical exam - Plan: Comprehensive metabolic panel, Lipid panel ? ?Screening for blood or protein in urine - Plan: Urinalysis, Routine w reflex microscopic ? ?Thyroid disorder screen - Plan: TSH ? ?Special screening, prostate cancer - Plan: PSA ? ?Low testosterone - Plan: Testosterone  ? ?HM-bill CPE at f/u too early for CPE ?Flu shot never had  ?tdap rx, shingrix rx future Rx parmacy  ?covid vx 3/3 utd moderna disc booster ?  ?Colonoscopy disc today leb GI referred again rec pt schedule ?Labs reviewed 06/2020 ?Normal PSA 0.83 12/01/19 declines had urology Dr. Farrel Gordon established  ?PSA had 2022 Dr. Yves Dill 10/26/2020 get results obtained 0.7 10/16/20  ?  ?Skin Dr. Evorn Gong h/o BCC, Aks ?rec healthy diet and exercise  ?  ?Hyperlipidemia, unspecified hyperlipidemia type - Plan: Comprehensive metabolic panel, Lipid panel, CBC with Differential/Platelet ?lipitor 10 mg qhs  ?  ?Provider: Dr. Olivia Mackie McLean-Scocuzza-Internal Medicine  ?

## 2021-10-20 NOTE — Patient Instructions (Addendum)
?MD Physician  ? ?Primary Contact Information ? ?Phone Fax E-mail Address  ?[phone] Not available [place]  ? [address]  ? [place] [address]  ?   ?Specialties     ?Gastroenterology     ? ? ? ?Thrombocytopenia ?Thrombocytopenia is a condition in which there are a low number of platelets in the blood. Platelets are also called thrombocytes. Platelets are parts of blood that stick together and form a clot to help the body stop bleeding after an injury. ?If you have too few platelets, your blood may have trouble clotting. This may cause you to bleed and bruise very easily. Some cases of thrombocytopenia are mild while others are more severe. ?What are the causes? ?This condition is caused by a low number of platelets in your blood. There are three main reasons for this: ?Your body not making enough platelets. This may be caused by: ?Bone marrow diseases. This include aplastic anemia, leukemia, and myelodysplastic anemia. ?Congenital thrombocytopenia. This is a condition that is passed from parent to child (inherited). ?Certain cancer treatments, including chemotherapy and radiation therapy. ?Infections from bacteria or viruses. ?Alcohol use disorder and alcoholism. ?Platelets not being released in the blood. This is called platelet sequestration and it can happen due to: ?An overactive spleen (hypersplenism). The spleen gathers up platelets from circulation, meaning that the platelets are not available to help with clotting your blood. The spleen can be enlarged because of scarring or other conditions. ?Gaucher disease. ?Your body destroying platelets too quickly. This may be caused by: ?An autoimmune disease that causes immune thrombocytopenia (ITP). ITP is sometimes associated with other autoimmune conditions such as lupus. ?Certain medicines, such as blood thinners. ?Certain blood clotting or bleeding disorders. ?Exposure to toxic chemicals, such as pesticides, lead, benzene, and  arsenic. ?Pregnancy. ?What are the signs or symptoms? ?Symptoms of this condition are the result of poor blood clotting. They will vary depending on how low the platelet counts are. Symptoms may include: ?Bruising easily. ?Bleeding from the mouth or nose. ?Heavy menstrual periods. ?Blood in the urine, stool (feces), or vomit. ?Purplish-red discolorations on the skin (purpura). ?A rash that looks like pinpoint, purplish-red spots (petechiae) on the lower legs. ?How is this diagnosed? ?This condition may be diagnosed with blood tests and a physical exam. ?You may also have other tests, including: ?A sample of bone marrow (biopsy) may be removed to look for the original cells that make platelets. ?An ultrasound or CT scan of the abdomen to check for an enlarged spleen, enlarged lymph nodes, or liver problems. ?How is this treated? ?Treatment for this condition depends on the cause. Treatment may include: ?Treatment of another condition that is causing the low platelet count. ?Medicines to help protect your platelets from being destroyed. ?A replacement (transfusion) of platelets to stop or prevent bleeding. ?Surgery to remove the spleen. ?Follow these instructions at home: ?Medicines ?Take over-the-counter and prescription medicines only as told by your health care provider. ?Do not take any medicines that contain aspirin or NSAIDs, such as ibuprofen. These medicines increase your risk for dangerous bleeding. ?Activity ?Avoid activities that could cause injury or bruising, and follow instructions about how to prevent falls. ?Do not play contact sports. Ask your health care provider what activities are safe for you. ?Take extra care to protect yourself from burns when ironing or cooking. ?Take extra care not to cut yourself when you shave or when you use scissors, needles, knives, and other tools. ?General instructions ? ?Check  your skin and the inside of your mouth for bruising or bleeding as told by your health care  provider. ?Wear a medical alert bracelet that says that you have a bleeding disorder. This can help you get the treatment you need in case of emergency. ?Check your urine and stool for blood as told by your health care provider. ?Do not drink alcohol. If you do drink alcohol, limit the amount that you drink. ?Minimize contact with toxic chemicals. ?Tell all your health care providers, including dental care providers and eye doctors, about your condition. Make sure to tell dental care providers before you have any procedure done, including dental cleanings. ?Keep all follow-up visits. This is important. ?Contact a health care provider if: ?You have unexplained bruising. ?You have new symptoms. ?You have symptoms that get worse. ?You have a fever. ?Get help right away if: ?You have severe bleeding from anywhere on your body. ?You have blood in your vomit, urine, or stool. ?You have an injury to your head. ?You have a sudden, severe headache. ?Summary ?Thrombocytopenia is a condition in which you have a low number of platelets in the blood. ?Platelets are parts of blood that stick together to form a clot. ?Symptoms of this condition are the result of poor blood clotting and may include bruising easily, bleeding from the nose or mouth, petechiae, and purpura. ?This condition may be diagnosed with blood tests and a physical exam. ?Treatment for this condition depends on the cause. ?This information is not intended to replace advice given to you by your health care provider. Make sure you discuss any questions you have with your health care provider. ?Document Revised: 12/29/2020 Document Reviewed: 12/29/2020 ?Elsevier Patient Education ? [place]. ? ?Tdap (Tetanus, Diphtheria, Pertussis) Vaccine: What You Need to Know ?1. Why get vaccinated? ?Tdap vaccine can prevent tetanus, diphtheria, and pertussis. ?Diphtheria and pertussis spread from person to person. Tetanus enters the body through cuts or  wounds. ?TETANUS (T) causes painful stiffening of the muscles. Tetanus can lead to serious health problems, including being unable to open the mouth, having trouble swallowing and breathing, or death. ?DIPHTHERIA (D) can lead to difficulty breathing, heart failure, paralysis, or death. ?PERTUSSIS (aP), also known as "whooping cough," can cause uncontrollable, violent coughing that makes it hard to breathe, eat, or drink. Pertussis can be extremely serious especially in babies and young children, causing pneumonia, convulsions, brain damage, or death. In teens and adults, it can cause weight loss, loss of bladder control, passing out, and rib fractures from severe coughing. ?2. Tdap vaccine ?Tdap is only for children 7 years and older, adolescents, and adults.  ?Adolescents should receive a single dose of Tdap, preferably at age 29 or 31 years. ?Pregnant people should get a dose of Tdap during every pregnancy, preferably during the early part of the third trimester, to help protect the newborn from pertussis. Infants are most at risk for severe, life-threatening complications from pertussis. ?Adults who have never received Tdap should get a dose of Tdap. ?Also, adults should receive a booster dose of either Tdap or Td (a different vaccine that protects against tetanus and diphtheria but not pertussis) every 10 years, or after 5 years in the case of a severe or dirty wound or burn. ?Tdap may be given at the same time as other vaccines. ?3. Talk with your health care provider ?Tell your vaccine provider if the person getting the vaccine: ?Has had an allergic reaction after a previous dose of any vaccine that  protects against tetanus, diphtheria, or pertussis, or has any severe, life-threatening allergies ?Has had a coma, decreased level of consciousness, or prolonged seizures within 7 days after a previous dose of any pertussis vaccine (DTP, DTaP, or Tdap) ?Has seizures or another nervous system problem ?Has ever had  Guillain-Barr? Syndrome (also called "GBS") ?Has had severe pain or swelling after a previous dose of any vaccine that protects against tetanus or diphtheria ?In some cases, your health care provider may decide to postpone Tdap v

## 2021-11-13 ENCOUNTER — Encounter: Payer: Self-pay | Admitting: Neurology

## 2021-11-13 ENCOUNTER — Other Ambulatory Visit: Payer: Self-pay | Admitting: Neurology

## 2021-11-13 DIAGNOSIS — Z79899 Other long term (current) drug therapy: Secondary | ICD-10-CM

## 2021-11-13 DIAGNOSIS — G35 Multiple sclerosis: Secondary | ICD-10-CM

## 2021-11-13 DIAGNOSIS — R200 Anesthesia of skin: Secondary | ICD-10-CM

## 2021-11-14 ENCOUNTER — Inpatient Hospital Stay: Admission: RE | Admit: 2021-11-14 | Payer: Managed Care, Other (non HMO) | Source: Ambulatory Visit

## 2021-11-15 ENCOUNTER — Ambulatory Visit: Payer: Managed Care, Other (non HMO) | Admitting: Neurology

## 2021-11-21 ENCOUNTER — Telehealth: Payer: Self-pay | Admitting: Neurology

## 2021-11-21 NOTE — Telephone Encounter (Signed)
Novella Rob: [id number] (exp. 11/21/21 to 05/20/22) order faxed to [facility], they will reach out to the patient to schedule. They will reach out to the patient to schedule.  ?

## 2021-11-23 NOTE — Telephone Encounter (Signed)
Scheduled 5/16 815am  ? ?

## 2021-12-15 ENCOUNTER — Other Ambulatory Visit (INDEPENDENT_AMBULATORY_CARE_PROVIDER_SITE_OTHER): Payer: Managed Care, Other (non HMO)

## 2021-12-15 DIAGNOSIS — R7989 Other specified abnormal findings of blood chemistry: Secondary | ICD-10-CM

## 2021-12-15 DIAGNOSIS — E785 Hyperlipidemia, unspecified: Secondary | ICD-10-CM | POA: Diagnosis not present

## 2021-12-15 DIAGNOSIS — Z0000 Encounter for general adult medical examination without abnormal findings: Secondary | ICD-10-CM | POA: Diagnosis not present

## 2021-12-15 DIAGNOSIS — Z125 Encounter for screening for malignant neoplasm of prostate: Secondary | ICD-10-CM | POA: Diagnosis not present

## 2021-12-15 DIAGNOSIS — D696 Thrombocytopenia, unspecified: Secondary | ICD-10-CM | POA: Diagnosis not present

## 2021-12-15 DIAGNOSIS — Z1329 Encounter for screening for other suspected endocrine disorder: Secondary | ICD-10-CM | POA: Diagnosis not present

## 2021-12-15 DIAGNOSIS — Z1389 Encounter for screening for other disorder: Secondary | ICD-10-CM

## 2021-12-15 LAB — COMPREHENSIVE METABOLIC PANEL
ALT: 27 U/L (ref 0–53)
AST: 15 U/L (ref 0–37)
Albumin: 4.3 g/dL (ref 3.5–5.2)
Alkaline Phosphatase: 70 U/L (ref 39–117)
BUN: 15 mg/dL (ref 6–23)
CO2: 24 mEq/L (ref 19–32)
Calcium: 9.1 mg/dL (ref 8.4–10.5)
Chloride: 108 mEq/L (ref 96–112)
Creatinine, Ser: 1.11 mg/dL (ref 0.40–1.50)
GFR: 74.07 mL/min (ref >60.00)
Glucose, Bld: 95 mg/dL (ref 70–99)
Potassium: 4.1 mEq/L (ref 3.5–5.1)
Sodium: 141 mEq/L (ref 135–145)
Total Bilirubin: 0.4 mg/dL (ref 0.2–1.2)
Total Protein: 6.8 g/dL (ref 6.0–8.3)

## 2021-12-15 LAB — CBC WITH DIFFERENTIAL/PLATELET
Basophils Absolute: 0 10*3/uL (ref 0.0–0.1)
Basophils Relative: 0.7 % (ref 0.0–3.0)
Eosinophils Absolute: 0.2 10*3/uL (ref 0.0–0.7)
Eosinophils Relative: 4.1 % (ref 0.0–5.0)
HCT: 41.6 % (ref 39.0–52.0)
Hemoglobin: 14.1 g/dL (ref 13.0–17.0)
Lymphocytes Relative: 19.8 % (ref 12.0–46.0)
Lymphs Abs: 1.1 10*3/uL (ref 0.7–4.0)
MCHC: 34.1 g/dL (ref 30.0–36.0)
MCV: 84.2 fl (ref 78.0–100.0)
Monocytes Absolute: 0.6 10*3/uL (ref 0.1–1.0)
Monocytes Relative: 9.8 % (ref 3.0–12.0)
Neutro Abs: 3.7 10*3/uL (ref 1.4–7.7)
Neutrophils Relative %: 65.6 % (ref 43.0–77.0)
Platelets: 139 10*3/uL — ABNORMAL LOW (ref 150.0–400.0)
RBC: 4.94 Mil/uL (ref 4.22–5.81)
RDW: 13.3 % (ref 11.5–15.5)
WBC: 5.7 10*3/uL (ref 4.0–10.5)

## 2021-12-15 LAB — LIPID PANEL
Cholesterol: 172 mg/dL (ref 0–200)
HDL: 45.3 mg/dL (ref >39.00)
LDL Cholesterol: 106 mg/dL — ABNORMAL HIGH (ref 0–99)
NonHDL: 126.49
Total CHOL/HDL Ratio: 4
Triglycerides: 101 mg/dL (ref 0.0–149.0)
VLDL: 20.2 mg/dL (ref 0.0–40.0)

## 2021-12-15 LAB — PSA: PSA: 1.51 ng/mL (ref 0.10–4.00)

## 2021-12-15 LAB — TESTOSTERONE: Testosterone: 417.53 ng/dL (ref 300.00–890.00)

## 2021-12-15 LAB — TSH: TSH: 2.59 u[IU]/mL (ref 0.35–5.50)

## 2021-12-16 LAB — URINALYSIS, ROUTINE W REFLEX MICROSCOPIC
Bilirubin Urine: NEGATIVE
Glucose, UA: NEGATIVE
Hgb urine dipstick: NEGATIVE
Ketones, ur: NEGATIVE
Leukocytes,Ua: NEGATIVE
Nitrite: NEGATIVE
Protein, ur: NEGATIVE
Specific Gravity, Urine: 1.019 (ref 1.001–1.035)
pH: 5.5 (ref 5.0–8.0)

## 2021-12-18 LAB — PATHOLOGIST SMEAR REVIEW

## 2021-12-20 ENCOUNTER — Encounter: Payer: Self-pay | Admitting: Neurology

## 2022-01-25 ENCOUNTER — Telehealth: Payer: Self-pay | Admitting: Neurology

## 2022-01-25 NOTE — Telephone Encounter (Signed)
Rescheduled appt with pt over the phone - MD out

## 2022-02-07 ENCOUNTER — Ambulatory Visit: Payer: Managed Care, Other (non HMO) | Admitting: Neurology

## 2022-02-28 ENCOUNTER — Encounter: Payer: Self-pay | Admitting: Neurology

## 2022-02-28 ENCOUNTER — Ambulatory Visit: Payer: Managed Care, Other (non HMO) | Admitting: Neurology

## 2022-02-28 VITALS — BP 116/80 | HR 93 | Ht 71.0 in | Wt 221.5 lb

## 2022-02-28 DIAGNOSIS — R5383 Other fatigue: Secondary | ICD-10-CM

## 2022-02-28 DIAGNOSIS — Z79899 Other long term (current) drug therapy: Secondary | ICD-10-CM

## 2022-02-28 DIAGNOSIS — G35 Multiple sclerosis: Secondary | ICD-10-CM

## 2022-02-28 DIAGNOSIS — R200 Anesthesia of skin: Secondary | ICD-10-CM | POA: Diagnosis not present

## 2022-02-28 DIAGNOSIS — M25562 Pain in left knee: Secondary | ICD-10-CM

## 2022-02-28 NOTE — Progress Notes (Signed)
[facility]  PATIENT: Perry Gentry DOB: 08/12/1965  REFERRING DOCTOR OR PCP:  [place] Pink SOURCE: patient and records  _________________________________   HISTORICAL  CHIEF COMPLAINT:  Chief Complaint  Patient presents with   Follow-up    Rm 1, alone. Here for 6 month MS f/u, on Vumerity and tolerating well.     HISTORY OF PRESENT ILLNESS:  Perry Gentry is a right handed 56 y.o. man with MS diagnosed in 2014.    Update 02/28/2022: He is on Vumerity and he tolerates it well (Tecfidera and DMF had high co-pays).    .   MRI ([facility]) was personally reviewed and it showed no new lesions.  Last week, when he woke up, he had a lot of tenderness in the left knee - one day later he was 90% better   Of note, he felt he had a better tan usual night.   It was very painful for a short period.   NSAID helped.     He denies any difficulty with gait, balance, strength .A few times with a turn he felt slightly off balanced for a second.   He sometimes uses the bannister on stairs.   He has numbness and has borderline CTS.   He sees urology for mild BPH and urinary hesitancy.  He has some urgency at times.   No incontinence.         He does note more issues with his left eye, especially  with exercising.  Vision is doing well.  He denies much fatigue.   He works as a Gaffer and travels some.   Cognition is doing well.   He has mild depression.  Pristiq had not helped and he stopped.      He is no longer exercising, hard to get motivated to do so but did in the past.     MS History:     In his 1s, he had visual changes in the left eye where he felt that was a veil over his vision.   Colors are still desaturated out of the left eye.   In 2010 he noted that when he would bend his neck forward he would have a zinging sensation going down from the spine to both of his hands. Over a couple years this improved.  He was noted numbness in his hands since the  middle of 2014. Sometimes, he would drop items.  There was not any pain nor any weakness.  [name] He saw orthopedics for his hand numbness in 2014 and had an MRI of the cervical spine. The MRI showed plaque at the C2-C3 region consistent with multiple sclerosis. It also showed degenerative changes at C6-C7 with severe bilateral foraminal stenosis. A subsequent MRI of the brain showed several white matter lesions (2 periventricular, 1-2 cortical foci)  consistent with multiple sclerosis. No abnormal enhancement.   He was referred to Dr. Manuella Ghazi in [place] who diagnosed him with multiple sclerosis.   He was started on Tecfidera.   STUDIES MRI of the cervical spine 2015 showed a spinal cord plaque at C3-C4 more to the left and also has degenerative changes to the right C3-C4 and bilaterally at C6-C7.  MRI of the brain 08/05/2016 shows a few  T2/FLAIR hyperintense foci in the  white matter, most nonspecific.  [name]  No new lesions compared to 2015.  MRI brain 11/26/17 showed several T2/FLAIR hyperintense foci in the white matter of the hemispheres.  This is a nonspecific finding and  could be due to chronic demyelinating plaque associated with multiple sclerosis but could also be due to chronic microvascular ischemic change.  None of the foci appears to be acute.   There is a normal enhancement pattern and there are no acute findings.   MRI cervical spine 11/26/2017 showed abnormal signal within the spinal cord adjacent to C3-C4.  This is unchanged when compared to the 11/03/2013 MRI     Multilevel degenerative changes as detailed above.  At C3-C4, there is moderately severe right foraminal narrowing that could lead to right C4 nerve root compression.  At C6-C7, there is borderline spinal stenosis and moderately severe left foraminal narrowing that could lead to left C7 nerve root compression.  MRI of the brain 12/14/2021 showed no new lesions compared to the MRI from 2019.  NCV/EMG 12/31/2017 shows Mild left chronic C7  radiculopathy without active features.    Borderline carpal tunnel syndromes   REVIEW OF SYSTEMS: Constitutional: No fevers, chills, sweats, or change in .   He notes some fatigue Eyes: No visual changes, double vision, eye pain Ear, nose and throat: No hearing loss, ear pain, nasal congestion, sore throat Cardiovascular: No chest pain, palpitations Respiratory:  No shortness of breath at rest or with exertion.   No wheezes GastrointestinaI: No nausea, vomiting, diarrhea, abdominal pain, fecal incontinence Genitourinary:  No dysuria, urinary retention or frequency.  No nocturia.   He has ED and decreased libido Musculoskeletal:  No neck pain, back pain Integumentary: No rash, pruritus, skin lesions Neurological: as above Psychiatric: as above Endocrine: No palpitations, diaphoresis, change in appetite, change in weigh or increased thirst Hematologic/Lymphatic:  No anemia, purpura, petechiae. Allergic/Immunologic: No itchy/runny eyes, nasal congestion, recent allergic reactions, rashes  ALLERGIES: No Known Allergies  HOME MEDICATIONS:  Current Outpatient Medications:    atorvastatin (LIPITOR) 10 MG tablet, TAKE 1 TABLET BY MOUTH EVERYDAY AT BEDTIME, Disp: 90 tablet, Rfl: 3   cholecalciferol (VITAMIN D) 1000 UNITS tablet, Take 1,000 Units by mouth daily., Disp: , Rfl:    Diroximel Fumarate (VUMERITY) 231 MG CPDR, Take 2 capsules by mouth in the morning and at bedtime., Disp: 360 capsule, Rfl: 3  PAST MEDICAL HISTORY: Past Medical History:  Diagnosis Date   Actinic keratoses    face use cream   Basal cell carcinoma    forehead Dr. Evorn Gong in 2019    COVID-19    03/05/21   Kidney stones    age 56   Multiple sclerosis ([place])    mild sxs in hand and left eye Dr. Kerman Passey neurology   Vision abnormalities     PAST SURGICAL HISTORY: Past Surgical History:  Procedure Laterality Date   CYSTOSCOPY WITH STENT PLACEMENT     LITHOTRIPSY     age 3 y.o    VASECTOMY      FAMILY  HISTORY: Family History  Problem Relation Age of Onset   Hypertension Mother    Hypertension Father    Ankylosing spondylitis Son        age 30     SOCIAL HISTORY:  Social History   Socioeconomic History   Marital status: Married    Spouse name: Not on file   Number of children: Not on file   Years of education: Not on file   Highest education level: Not on file  Occupational History   Not on file  Tobacco Use   Smoking status: Never   Smokeless tobacco: Never  Vaping Use   Vaping Use: Never used  Substance  and Sexual Activity   Alcohol use: Yes    Comment: "maybe once a month"   Drug use: Not Currently   Sexual activity: Yes    Partners: Female  Other Topics Concern   Not on file  Social History Narrative   Lives at home with his wife    Right handed   2 kids 1 son and 1 daughter    DPR wife    Works Medical laboratory scientific officer for [employer]    Exercises 30 min 4 x per week    Name originates from [place]    Born in [place] Strain: Not on [employer] Insecurity: Not on file  Transportation Needs: Not on file  Physical Activity: Not on file  Stress: Not on file  Social Connections: Not on file  Intimate Partner Violence: Not on file     PHYSICAL EXAM  Vitals:   02/28/22 0820  BP: 116/80  Pulse: 93  Weight: 221 lb 8 oz (100.5 kg)  Height: '5\' 11"'$  (1.803 m)    Body mass index is 30.89 kg/m.   General: The patient is well-developed and well-nourished and in no acute distress   Neurologic Exam  Mental status: The patient is alert and oriented x 3 at the time of the examination. The patient has apparent normal recent and remote memory, with an apparently normal attention span and concentration ability.   Speech is normal.  Cranial nerves: Extraocular movements are full.  He has reduced color vision OS.   Facial strength and sensation is normal.  Trapezius strength is normal no obvious hearing  deficits are noted.  Motor:  Muscle bulk is normal.   Muscle tone is normal.  Strength is 5/5.[name]   Sensory: Sensory testing is intact to touch and vibration in the arms and legs.  Coordination: Cerebellar testing reveals good finger-nose-finger and heel-to-shin bilaterally.  Gait and station: Station is normal.  Gait is normal.  Tandem gait is minimally wide.  Romberg is negative.  Reflexes: Deep tendon reflexes are symmetric and normal bilaterally.        ASSESSMENT AND PLAN  Relapsing remitting multiple sclerosis (HCC)  Hand numbness  High risk medication use  Other fatigue  Acute pain of left knee   1.    His MS has been very stable.  Continue Vumerity.    He has recent blood work from primary care and the lymphocyte count was 1.1. 2.   Stay active and exercise as tolerated.   3.    Etiology of knee pain is unclear.  He is back to baseline.  If symptoms recur advised taking an NSAID and if they recur frequently we will check an x-ray.   4.  He will return to see me in 6 months if stable but call sooner if he has any significant new or worsening neurologic symptoms   Michaiah Maiden A. Felecia Shelling, MD, PhD 10/03/6281, 1:51 AM Certified in Neurology, Clinical Neurophysiology, Sleep Medicine, Pain Medicine and Neuroimaging  [facility] [address] [place], [place] [address] [phone]

## 2022-04-10 ENCOUNTER — Other Ambulatory Visit: Payer: Self-pay | Admitting: Internal Medicine

## 2022-04-10 ENCOUNTER — Telehealth: Payer: Self-pay | Admitting: Internal Medicine

## 2022-04-10 DIAGNOSIS — E785 Hyperlipidemia, unspecified: Secondary | ICD-10-CM

## 2022-04-10 MED ORDER — ATORVASTATIN CALCIUM 10 MG PO TABS: ORAL_TABLET | ORAL | 3 refills | Status: DC

## 2022-04-10 NOTE — Telephone Encounter (Signed)
Pt need a refill on atorvastatin sent to [facility] [address]

## 2022-04-21 IMAGING — CT CT CARDIAC CORONARY ARTERY CALCIUM SCORE
3 series · 14 of 20 positions shown, 16 images · non-contrast
Comparison: None.
COMPARISON: None.

Addendum:
EXAM:
OVER-READ INTERPRETATION  CT CHEST

The following report is an over-read performed by radiologist Dr.
Zhongyi Spade [REDACTED] on 03/14/2020. This
over-read does not include interpretation of cardiac or coronary
anatomy or pathology. The coronary calcium score/coronary CTA
interpretation by the cardiologist is attached.
CLINICAL DATA: Risk stratification
Coronary Calcium Score
TECHNIQUE: The patient was scanned on a Siemens go.Top Scanner. Axial
non-contrast 3 mm slices were carried out through the heart. The
data set was analyzed on a dedicated work station and scored using
the Agatson method.

[Series 2: sa36 calcium scoring 3.00 · axial · 0.36mm/px · z∈[-1122,-1041]mm · 4 of 45 slices shown]
[im 9/45  vessel]
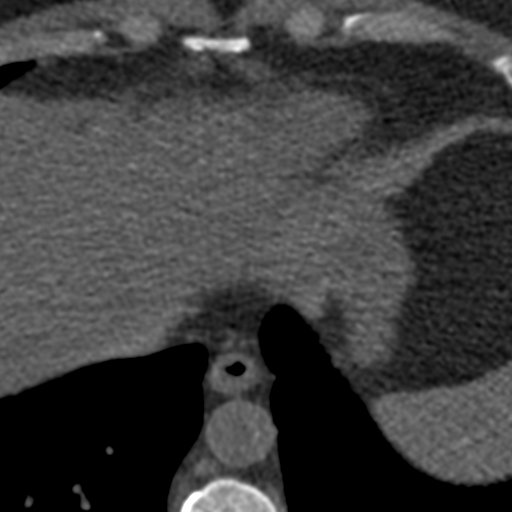
[im 18/45  vessel]
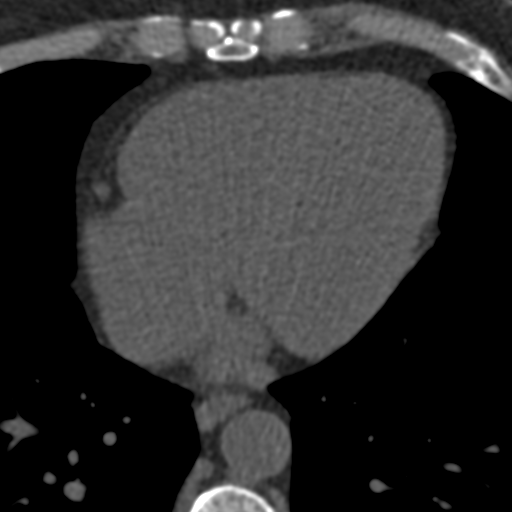
[im 27/45  vessel]
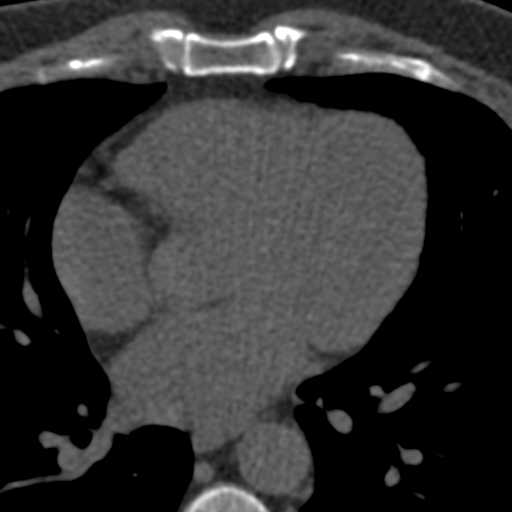
[im 36/45  vessel]
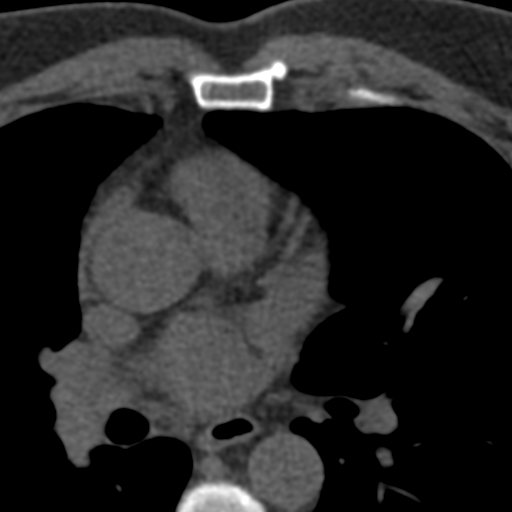

[Series 5: full fov st calcium scoring 3.00 · axial · 0.74mm/px · z∈[-1125,-1038]mm · 5 of 45 slices shown, 7 images]
[im 8/45  vessel]
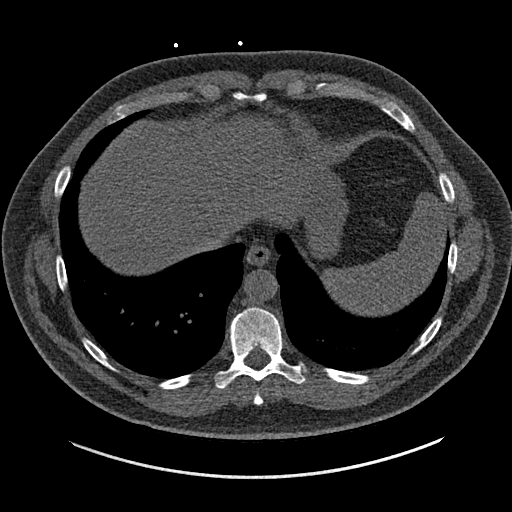
[im 8/45  lung]
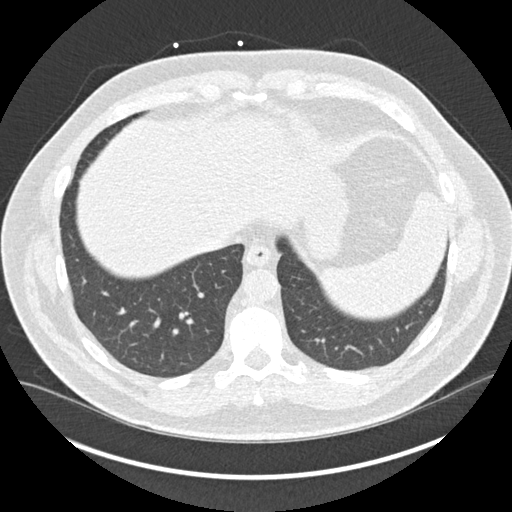
[im 15/45  vessel]
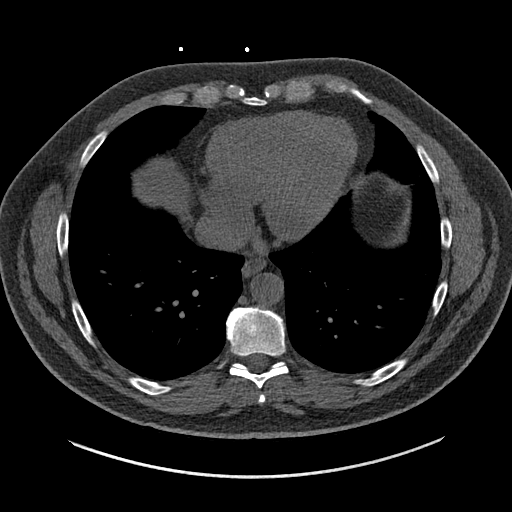
[im 23/45  vessel]
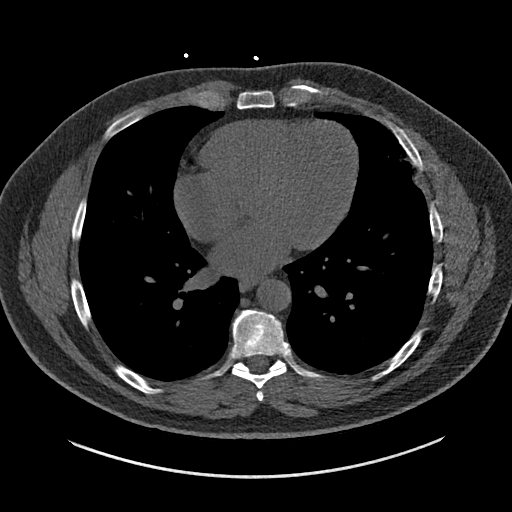
[im 30/45  vessel]
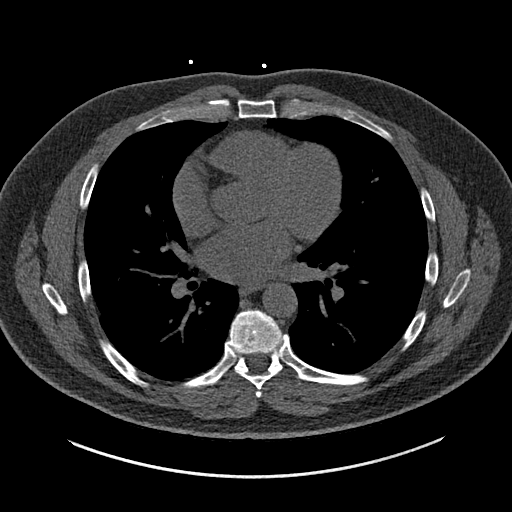
[im 37/45  vessel]
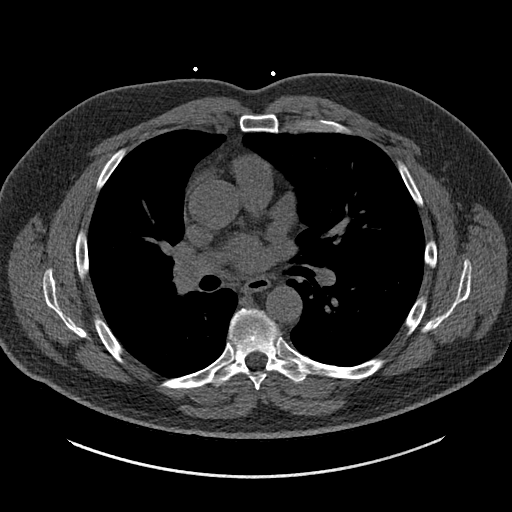
[im 37/45  lung]
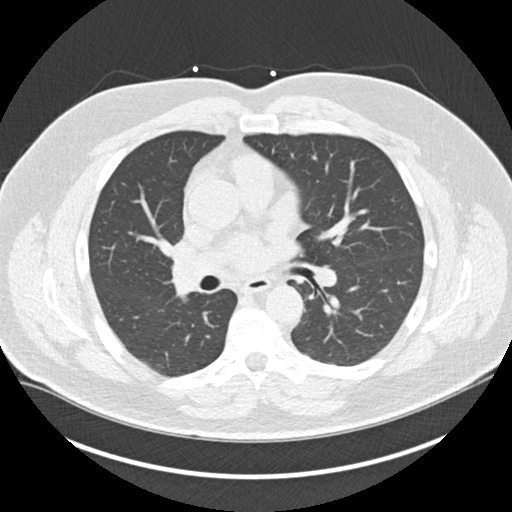

[Series 10: full fov lungs calcium scoring 3.00 ax · axial · 0.74mm/px · z∈[-1125,-1038]mm · 5 of 45 slices shown]
[im 8/45  vessel]
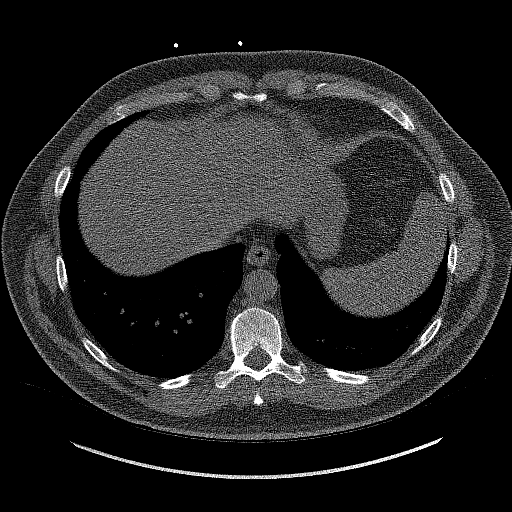
[im 15/45  vessel]
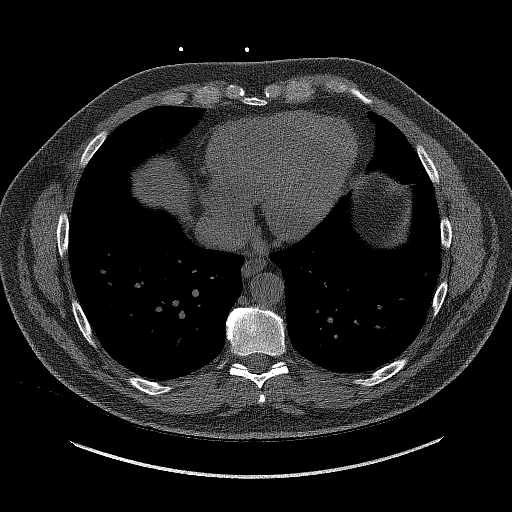
[im 23/45  vessel]
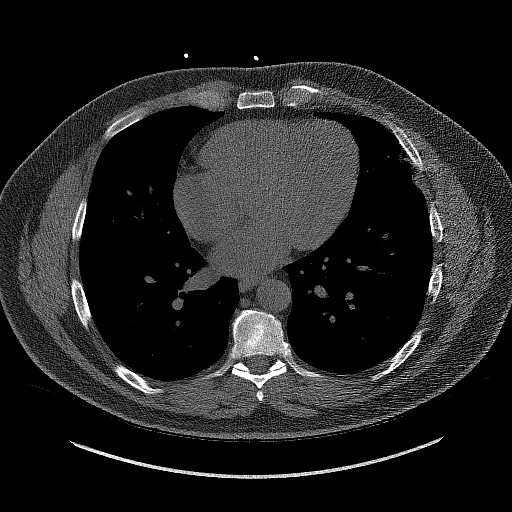
[im 30/45  vessel]
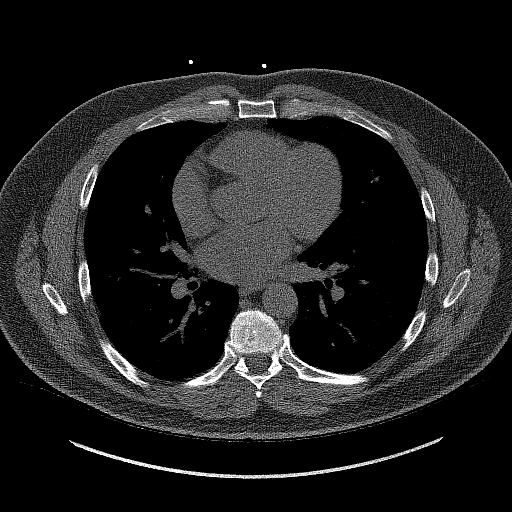
[im 37/45  vessel]
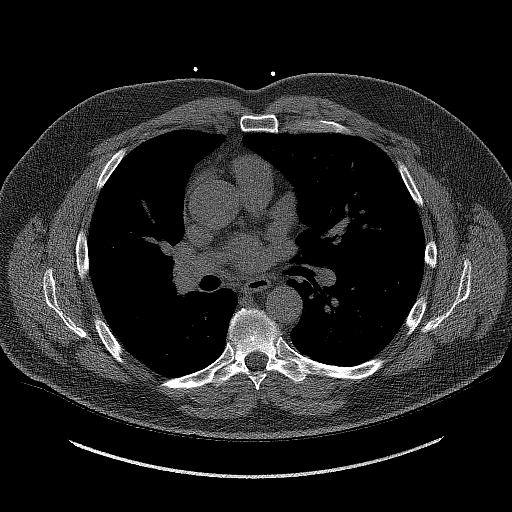

[14 of 20 positions shown; findings below may reference images not displayed]

FINDINGS: Mild scarring in the inferior segment of the lingula. Within the
visualized portions of the thorax there are no suspicious appearing
pulmonary nodules or masses, there is no acute consolidative
airspace disease, no pleural effusions, no pneumothorax and no
lymphadenopathy. Visualized portions of the upper abdomen are
unremarkable. There are no aggressive appearing lytic or blastic
lesions noted in the visualized portions of the skeleton.
IMPRESSION: No significant incidental noncardiac findings are noted.
FINDINGS: Non-cardiac: See separate report from [REDACTED].

Ascending Aorta:

Pericardium: Normal

Coronary arteries: Normal origin of left and right coronary
arteries. Calcification distribution as noted below;

LM 0

LAD

LCx

RCA 0

Total
IMPRESSION: Very low coronary calcium score of 5.27. This was 49th percentile
for age and sex matched control.

*** End of Addendum ***
EXAM:
OVER-READ INTERPRETATION  CT CHEST

The following report is an over-read performed by radiologist Dr.
Zhongyi Spade [REDACTED] on 03/14/2020. This
over-read does not include interpretation of cardiac or coronary
anatomy or pathology. The coronary calcium score/coronary CTA
interpretation by the cardiologist is attached.
FINDINGS: Mild scarring in the inferior segment of the lingula. Within the
visualized portions of the thorax there are no suspicious appearing
pulmonary nodules or masses, there is no acute consolidative
airspace disease, no pleural effusions, no pneumothorax and no
lymphadenopathy. Visualized portions of the upper abdomen are
unremarkable. There are no aggressive appearing lytic or blastic
lesions noted in the visualized portions of the skeleton.
IMPRESSION: No significant incidental noncardiac findings are noted.

## 2022-04-24 ENCOUNTER — Encounter: Payer: Managed Care, Other (non HMO) | Admitting: Internal Medicine

## 2022-05-04 ENCOUNTER — Telehealth: Payer: Self-pay | Admitting: Internal Medicine

## 2022-05-04 ENCOUNTER — Ambulatory Visit (INDEPENDENT_AMBULATORY_CARE_PROVIDER_SITE_OTHER): Payer: Managed Care, Other (non HMO) | Admitting: Internal Medicine

## 2022-05-04 ENCOUNTER — Encounter: Payer: Self-pay | Admitting: Internal Medicine

## 2022-05-04 VITALS — BP 132/80 | HR 80 | Temp 98.2°F | Ht 71.0 in | Wt 219.6 lb

## 2022-05-04 DIAGNOSIS — E785 Hyperlipidemia, unspecified: Secondary | ICD-10-CM | POA: Diagnosis not present

## 2022-05-04 DIAGNOSIS — D696 Thrombocytopenia, unspecified: Secondary | ICD-10-CM

## 2022-05-04 DIAGNOSIS — Z0000 Encounter for general adult medical examination without abnormal findings: Secondary | ICD-10-CM | POA: Diagnosis not present

## 2022-05-04 DIAGNOSIS — Z23 Encounter for immunization: Secondary | ICD-10-CM

## 2022-05-04 MED ORDER — TETANUS-DIPHTH-ACELL PERTUSSIS 5-2.5-18.5 LF-MCG/0.5 IM SUSP: 0.5000 mL | Freq: Once | INTRAMUSCULAR | 0 refills | Status: AC

## 2022-05-04 MED ORDER — SHINGRIX 50 MCG/0.5ML IM SUSR: 0.5000 mL | Freq: Once | INTRAMUSCULAR | 1 refills | Status: AC

## 2022-05-04 NOTE — Telephone Encounter (Signed)
Please call pt  see check out and schedule f/u labs asap next week and f/u with new PCP as noted visit f/u note from 05/04/22   Thank you

## 2022-05-04 NOTE — Patient Instructions (Signed)
Dr. Havery Moros call for colonoscopy   Phone Fax E-mail Address  [phone] Not available [place]   [address]   [place] [address]     Specialties     Gastroenterology      Colonoscopy, Adult A colonoscopy is a procedure to look at the entire large intestine. This procedure is done using a long, thin, flexible tube that has a camera on the end. You may have a colonoscopy: As a part of normal colorectal screening. If you have certain symptoms, such as: A low number of red blood cells in your blood (anemia). Diarrhea that does not go away. Pain in your abdomen. Blood in your stool. A colonoscopy can help screen for and diagnose medical problems, including: An abnormal growth of cells or tissue (tumor). Abnormal growths within the lining of your intestine (polyps). Inflammation. Areas of bleeding. Tell your health care provider about: Any allergies you have. All medicines you are taking, including vitamins, herbs, eye drops, creams, and over-the-counter medicines. Any problems you or family members have had with anesthetic medicines. Any bleeding problems you have. Any surgeries you have had. Any medical conditions you have. Any problems you have had with having bowel movements. Whether you are pregnant or may be pregnant. What are the risks? Generally, this is a safe procedure. However, problems may occur, including: Bleeding. Damage to your intestine. Allergic reactions to medicines given during the procedure. Infection. This is rare. What happens before the procedure? Eating and drinking restrictions Follow instructions from your health care provider about eating or drinking restrictions, which may include: A few days before the procedure: Follow a low-fiber diet. Avoid nuts, seeds, dried fruit, raw fruits, and vegetables. 1-3 days before the procedure: Eat only gelatin dessert or ice pops. Drink only clear liquids, such as water, clear juice, clear  broth or bouillon, black coffee or tea, or clear soft drinks or sports drinks. Avoid liquids that contain red or purple dye. The day of the procedure: Do not eat solid foods. You may continue to drink clear liquids until up to 2 hours before the procedure. Do not eat or drink anything starting 2 hours before the procedure, or within the time period that your health care provider recommends. Bowel prep If you were prescribed a bowel prep to take by mouth (orally) to clean out your colon: Take it as told by your health care provider. Starting the day before your procedure, you will need to drink a large amount of liquid medicine. The liquid will cause you to have many bowel movements of loose stool until your stool becomes almost clear or light green. If your skin or the opening between the buttocks (anus) gets irritated from diarrhea, you may relieve the irritation using: Wipes with medicine in them, such as adult wet wipes with aloe and vitamin E. A product to soothe skin, such as petroleum jelly. If you vomit while drinking the bowel prep: Take a break for up to 60 minutes. Begin the bowel prep again. Call your health care provider if you keep vomiting or you cannot take the bowel prep without vomiting. To clean out your colon, you may also be given: Laxative medicines. These help you have a bowel movement. Instructions for enema use. An enema is liquid medicine injected into your rectum. Medicines Ask your health care provider about: Changing or stopping your regular medicines or supplements. This is especially important if you are taking iron supplements, diabetes medicines, or blood thinners. Taking medicines  such as aspirin and ibuprofen. These medicines can thin your blood. Do not take these medicines unless your health care provider tells you to take them. Taking over-the-counter medicines, vitamins, herbs, and supplements. General instructions Ask your health care provider what steps  will be taken to help prevent infection. These may include washing skin with a germ-killing soap. If you will be going home right after the procedure, plan to have a responsible adult: Take you home from the hospital or clinic. You will not be allowed to drive. Care for you for the time you are told. What happens during the procedure?  An IV will be inserted into one of your veins. You will be given a medicine to make you fall asleep (general anesthetic). You will lie on your side with your knees bent. A lubricant will be put on the tube. Then the tube will be: Inserted into your anus. Gently eased through all parts of your large intestine. Air will be sent into your colon to keep it open. This may cause some pressure or cramping. Images will be taken with the camera and will appear on a screen. A small tissue sample may be removed to be looked at under a microscope (biopsy). The tissue may be sent to a lab for testing if any signs of problems are found. If small polyps are found, they may be removed and checked for cancer cells. When the procedure is finished, the tube will be removed. The procedure may vary among health care providers and hospitals. What happens after the procedure? Your blood pressure, heart rate, breathing rate, and blood oxygen level will be monitored until you leave the hospital or clinic. You may have a small amount of blood in your stool. You may pass gas and have mild cramping or bloating in your abdomen. This is caused by the air that was used to open your colon during the exam. If you were given a sedative during the procedure, it can affect you for several hours. Do not drive or operate machinery until your health care provider says that it is safe. It is up to you to get the results of your procedure. Ask your health care provider, or the department that is doing the procedure, when your results will be ready. Summary A colonoscopy is a procedure to look at the  entire large intestine. Follow instructions from your health care provider about eating and drinking before the procedure. If you were prescribed an oral bowel prep to clean out your colon, take it as told by your health care provider. During the colonoscopy, a flexible tube with a camera on its end is inserted into the anus and then passed into all parts of the large intestine. This information is not intended to replace advice given to you by your health care provider. Make sure you discuss any questions you have with your health care provider. Document Revised: 07/10/2021 Document Reviewed: 03/08/2021 Elsevier Patient Education  [place].

## 2022-05-04 NOTE — Progress Notes (Signed)
Chief Complaint  Patient presents with   Annual Exam   Annual fasting today no complaints  1. Hld on lipitor 10 mg qhs  2. Low plts 139 will refer h/o if still present    Review of Systems  Constitutional:  Negative for weight loss.  HENT:  Negative for hearing loss.   Eyes:  Negative for blurred vision.  Respiratory:  Negative for shortness of breath.   Cardiovascular:  Negative for chest pain.  Gastrointestinal:  Negative for abdominal pain and blood in stool.  Musculoskeletal:  Negative for back pain.  Skin:  Negative for rash.  Neurological:  Negative for headaches.  Psychiatric/Behavioral:  Negative for depression.    Past Medical History:  Diagnosis Date   Actinic keratoses    face use cream   Basal cell carcinoma    forehead Dr. Evorn Gong in 2019    COVID-19    03/05/21   Kidney stones    age 12   Multiple sclerosis ([place])    mild sxs in hand and left eye Dr. Kerman Passey neurology   Vision abnormalities    Past Surgical History:  Procedure Laterality Date   CYSTOSCOPY WITH STENT PLACEMENT     LITHOTRIPSY     age 53 y.o    VASECTOMY     Family History  Problem Relation Age of Onset   Hypertension Mother    Hypertension Father    Ankylosing spondylitis Son        age 32    Social History   Socioeconomic History   Marital status: Married    Spouse name: Not on file   Number of children: Not on file   Years of education: Not on file   Highest education level: Not on file  Occupational History   Not on file  Tobacco Use   Smoking status: Never   Smokeless tobacco: Never  Vaping Use   Vaping Use: Never used  Substance and Sexual Activity   Alcohol use: Yes    Comment: "maybe once a month"   Drug use: Not Currently   Sexual activity: Yes    Partners: Female  Other Topics Concern   Not on file  Social History Narrative   Lives at home with his wife    Right handed   2 kids 1 son and 1 daughter    DPR wife    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   Diroximel Fumarate (VUMERITY) 231 MG CPDR Take 2 capsules by mouth in the morning and at bedtime.   Tdap (BOOSTRIX) 5-2.5-18.5 LF-MCG/0.5 injection Inject 0.5 mLs into the muscle once for 1 dose.   Zoster Vaccine Adjuvanted [facility]) injection Inject 0.5 mLs into the muscle once for 1 dose. 2nd dose 2 to < 6 months   No Known Allergies No results found for this or any previous visit (  from the past 2160 hour(s)). Objective  Body mass index is 30.63 kg/m. Wt Readings from Last 3 Encounters:  05/04/22 219 lb 9.6 oz (99.6 kg)  02/28/22 221 lb 8 oz (100.5 kg)  10/20/21 218 lb (98.9 kg)   Temp Readings from Last 3 Encounters:  05/04/22 98.2 F (36.8 C) (Oral)  10/20/21 98 F (36.7 C) (Oral)  12/06/20 (!) 97.4 F (36.3 C) (Oral)   BP Readings from Last 3 Encounters:  05/04/22 132/80  02/28/22 116/80  10/20/21 124/82   Pulse Readings from Last 3 Encounters:  05/04/22 80  02/28/22 93  10/20/21 77    Physical Exam Vitals and nursing note reviewed.  Constitutional:      Appearance: Normal appearance. He is well-developed and well-groomed.  HENT:     Head: Normocephalic and atraumatic.  Eyes:     Conjunctiva/sclera: Conjunctivae normal.     Pupils: Pupils are equal, round, and reactive to light.  Cardiovascular:     Rate and Rhythm: Normal rate and regular rhythm.     Heart sounds: Normal heart sounds.  Pulmonary:      Effort: Pulmonary effort is normal. No respiratory distress.     Breath sounds: Normal breath sounds.  Abdominal:     Tenderness: There is no abdominal tenderness.  Skin:    General: Skin is warm and moist.  Neurological:     General: No focal deficit present.     Mental Status: He is alert and oriented to person, place, and time. Mental status is at baseline.     Sensory: Sensation is intact.     Motor: Motor function is intact.     Coordination: Coordination is intact.     Gait: Gait is intact. Gait normal.  Psychiatric:        Attention and Perception: Attention and perception normal.        Mood and Affect: Mood and affect normal.        Speech: Speech normal.        Behavior: Behavior normal. Behavior is cooperative.        Thought Content: Thought content normal.        Cognition and Memory: Cognition and memory normal.        Judgment: Judgment normal.     Assessment  Plan  Annual physical exam See below   Low platelet count ([place]) - Plan: CBC with Differential/Platelet, Pathologist smear review, consider referral to heme in the future if low plts continue  Hyperlipidemia, unspecified hyperlipidemia type - Plan: Comprehensive metabolic panel, Lipid panel On lipitor 10 mg qd   MS stable f/u Dr. Felecia Shelling  HM Flu shot never had  tdap rx, shingrix rx future Rx pharmacy  covid vx 3/3 utd moderna disc booster ? If will get get another   Colonoscopy disc today leb GI referred again rec pt schedule Labs reviewed 06/2020, pt to call and schedule givne # again   Normal PSA 1.51 12/15/21 urology Dr. Farrel Gordon established   Declines hep C/HIV   Skin Dr. Evorn Gong h/o [facility], Aks last seen 2023  rec healthy diet and exercise    Provider: Dr. Olivia Mackie McLean-Scocuzza-[facility]

## 2022-05-07 ENCOUNTER — Telehealth: Payer: Self-pay

## 2022-05-07 NOTE — Telephone Encounter (Signed)
LMOM for pt to CB.  

## 2022-05-07 NOTE — Telephone Encounter (Signed)
LMOM for pt to CB to get scheduled asap for labs this week per Dr. Olivia Mackie and to get him scheduled with f/u with Dr. Volanda Napoleon who he is transferring care to:    McLean-Scocuzza, Nino Glow, MD 3 days ago   TM Please call pt  see check out and schedule f/u labs asap next week and f/u with new PCP as noted visit f/u note from 05/04/22    Thank you

## 2022-05-07 NOTE — Telephone Encounter (Signed)
Hi pt has lab visit Tuesday  Can you also see prior check out note and schedule f/u with Dr. Volanda Napoleon   Thank you

## 2022-05-07 NOTE — Telephone Encounter (Signed)
Pt has been scheduled tomorrow @ 915 am for fasting labs

## 2022-05-08 ENCOUNTER — Other Ambulatory Visit (INDEPENDENT_AMBULATORY_CARE_PROVIDER_SITE_OTHER): Payer: Managed Care, Other (non HMO)

## 2022-05-08 DIAGNOSIS — D696 Thrombocytopenia, unspecified: Secondary | ICD-10-CM

## 2022-05-08 DIAGNOSIS — E785 Hyperlipidemia, unspecified: Secondary | ICD-10-CM

## 2022-05-08 LAB — COMPREHENSIVE METABOLIC PANEL
ALT: 32 U/L (ref 0–53)
AST: 18 U/L (ref 0–37)
Albumin: 4.4 g/dL (ref 3.5–5.2)
Alkaline Phosphatase: 67 U/L (ref 39–117)
BUN: 14 mg/dL (ref 6–23)
CO2: 27 mEq/L (ref 19–32)
Calcium: 9.3 mg/dL (ref 8.4–10.5)
Chloride: 104 mEq/L (ref 96–112)
Creatinine, Ser: 1.09 mg/dL (ref 0.40–1.50)
GFR: 75.5 mL/min (ref >60.00)
Glucose, Bld: 86 mg/dL (ref 70–99)
Potassium: 4.1 mEq/L (ref 3.5–5.1)
Sodium: 138 mEq/L (ref 135–145)
Total Bilirubin: 0.7 mg/dL (ref 0.2–1.2)
Total Protein: 7 g/dL (ref 6.0–8.3)

## 2022-05-08 LAB — CBC WITH DIFFERENTIAL/PLATELET
Basophils Absolute: 0 10*3/uL (ref 0.0–0.1)
Basophils Relative: 0.7 % (ref 0.0–3.0)
Eosinophils Absolute: 0.1 10*3/uL (ref 0.0–0.7)
Eosinophils Relative: 1.9 % (ref 0.0–5.0)
HCT: 43.6 % (ref 39.0–52.0)
Hemoglobin: 14.5 g/dL (ref 13.0–17.0)
Lymphocytes Relative: 23.9 % (ref 12.0–46.0)
Lymphs Abs: 1.6 10*3/uL (ref 0.7–4.0)
MCHC: 33.4 g/dL (ref 30.0–36.0)
MCV: 85.4 fl (ref 78.0–100.0)
Monocytes Absolute: 0.5 10*3/uL (ref 0.1–1.0)
Monocytes Relative: 7.5 % (ref 3.0–12.0)
Neutro Abs: 4.5 10*3/uL (ref 1.4–7.7)
Neutrophils Relative %: 66 % (ref 43.0–77.0)
Platelets: 162 10*3/uL (ref 150.0–400.0)
RBC: 5.1 Mil/uL (ref 4.22–5.81)
RDW: 13.4 % (ref 11.5–15.5)
WBC: 6.8 10*3/uL (ref 4.0–10.5)

## 2022-05-08 LAB — LIPID PANEL
Cholesterol: 171 mg/dL (ref 0–200)
HDL: 48 mg/dL (ref >39.00)
LDL Cholesterol: 107 mg/dL — ABNORMAL HIGH (ref 0–99)
NonHDL: 123.04
Total CHOL/HDL Ratio: 4
Triglycerides: 79 mg/dL (ref 0.0–149.0)
VLDL: 15.8 mg/dL (ref 0.0–40.0)

## 2022-05-09 LAB — PATHOLOGIST SMEAR REVIEW

## 2022-05-21 ENCOUNTER — Telehealth: Payer: Self-pay | Admitting: *Deleted

## 2022-05-21 NOTE — Telephone Encounter (Signed)
PA Vumerity submitted on CMM. Key: [id number].Waiting on determination from [place].

## 2022-05-22 NOTE — Telephone Encounter (Signed)
PA approved  05/21/2022 to 05/22/2023. PA# [place] [id number]

## 2022-07-26 ENCOUNTER — Encounter: Payer: Managed Care, Other (non HMO) | Admitting: Nurse Practitioner

## 2022-07-26 ENCOUNTER — Ambulatory Visit: Payer: Managed Care, Other (non HMO) | Admitting: Nurse Practitioner

## 2022-07-26 VITALS — BP 122/80 | HR 84 | Temp 98.9°F | Ht 71.0 in | Wt 220.8 lb

## 2022-07-26 DIAGNOSIS — D696 Thrombocytopenia, unspecified: Secondary | ICD-10-CM

## 2022-07-26 DIAGNOSIS — E785 Hyperlipidemia, unspecified: Secondary | ICD-10-CM

## 2022-07-26 DIAGNOSIS — G35 Multiple sclerosis: Secondary | ICD-10-CM | POA: Diagnosis not present

## 2022-07-26 NOTE — Assessment & Plan Note (Signed)
Stable on Lipitor '10mg'$  daily. Continue medication as prescribed. Work on [employer] and exercise.

## 2022-07-26 NOTE — Progress Notes (Signed)
Tomasita Morrow, NP-C Phone: [phone]  Perry Gentry is a 56 y.o. male who presents today for transfer of care.   He is doing well. He has no complaints or concerns.  Hyperlipidemia- Patient taking Lipitor 10 daily. He reports working on diet and exercise.  Lipid Panel     Component Value Date/Time   CHOL 171 05/08/2022 0909   CHOL 267 (H) 08/15/2018 1035   TRIG 79.0 05/08/2022 0909   HDL 48.00 05/08/2022 0909   HDL 42 08/15/2018 1035   CHOLHDL 4 05/08/2022 0909   VLDL 15.8 05/08/2022 0909   LDLCALC 107 (H) 05/08/2022 0909   LDLCALC 206 (H) 08/15/2018 1035   LABVLDL 19 08/15/2018 1035    Multiple Sclerosis- Followed by Neurology, Dr. Felecia Shelling. Reports doing well.  Thrombocytopenia- Hx of low platelets that has since resolved.      Latest Ref Rng & Units 05/08/2022    9:09 AM 12/15/2021    7:48 AM 08/09/2021   11:28 AM  CBC  WBC 4.0 - 10.5 K/uL 6.8  5.7  6.3   Hemoglobin 13.0 - 17.0 g/dL 14.5  14.1  15.7   Hematocrit 39.0 - 52.0 % 43.6  41.6  46.1   Platelets 150.0 - 400.0 K/uL 162.0  139.0  178      Social History   Tobacco Use  Smoking Status Never  Smokeless Tobacco Never    Current Outpatient Medications on File Prior to Visit  Medication Sig Dispense Refill   atorvastatin (LIPITOR) 10 MG tablet TAKE 1 TABLET BY MOUTH EVERYDAY AT BEDTIME 90 tablet 3   cholecalciferol (VITAMIN D) 1000 UNITS tablet Take 1,000 Units by mouth daily.     Diroximel Fumarate (VUMERITY) 231 MG CPDR Take 2 capsules by mouth in the morning and at bedtime. 360 capsule 3   No current facility-administered medications on file prior to visit.     ROS see history of present illness  Objective  Physical Exam Vitals:   07/26/22 1005  BP: 122/80  Pulse: 84  Temp: 98.9 F (37.2 C)  SpO2: 99%    BP Readings from Last 3 Encounters:  07/26/22 122/80  05/04/22 132/80  02/28/22 116/80   Wt Readings from Last 3 Encounters:  07/26/22 220 lb 12.8 oz (100.2 kg)  05/04/22 219 lb  9.6 oz (99.6 kg)  02/28/22 221 lb 8 oz (100.5 kg)    Physical Exam Constitutional:      General: He is not in acute distress.    Appearance: Normal appearance.  HENT:     Head: Normocephalic.  Cardiovascular:     Rate and Rhythm: Normal rate and regular rhythm.     Heart sounds: Normal heart sounds.  Pulmonary:     Effort: Pulmonary effort is normal.     Breath sounds: Normal breath sounds.  Neurological:     Mental Status: He is alert.  Psychiatric:        Mood and Affect: Mood normal.        Behavior: Behavior normal.     Assessment/Plan: Please see individual problem list.  Hyperlipidemia, unspecified hyperlipidemia type Assessment & Plan: Stable on Lipitor '10mg'$  daily. Continue medication as prescribed. Work on [employer] and exercise.    Relapsing remitting multiple sclerosis ([name]) Assessment & Plan: Stable. Follow up with Neurology- Dr. Felecia Shelling on February 8th as scheduled.    Thrombocytopenia ([place]) Assessment & Plan: Platelets normal- 162 in October. Will continue to monitor and recheck CBC in 3 months if patient does not have  done with Neurology at appointment in February.     Return in about 10 months (around 05/27/2023) for annual exam or sooner if needed.   Tomasita Morrow, NP-C [place]

## 2022-07-26 NOTE — Assessment & Plan Note (Signed)
Stable. Follow up with Neurology- Dr. Felecia Shelling on February 8th as scheduled.

## 2022-07-26 NOTE — Assessment & Plan Note (Addendum)
Platelets normal- 162 in October. Will continue to monitor and recheck CBC in 3 months if patient does not have done with Neurology at appointment in February.

## 2022-09-06 ENCOUNTER — Ambulatory Visit: Payer: 59 | Admitting: Neurology

## 2022-09-06 ENCOUNTER — Encounter: Payer: Self-pay | Admitting: Neurology

## 2022-09-06 VITALS — BP 140/85 | HR 72 | Ht 71.0 in | Wt 223.0 lb

## 2022-09-06 DIAGNOSIS — G35 Multiple sclerosis: Secondary | ICD-10-CM

## 2022-09-06 DIAGNOSIS — Z79899 Other long term (current) drug therapy: Secondary | ICD-10-CM | POA: Diagnosis not present

## 2022-09-06 DIAGNOSIS — Z8669 Personal history of other diseases of the nervous system and sense organs: Secondary | ICD-10-CM | POA: Diagnosis not present

## 2022-09-06 DIAGNOSIS — R5383 Other fatigue: Secondary | ICD-10-CM

## 2022-09-06 NOTE — Progress Notes (Signed)
[facility]  PATIENT: Perry Gentry DOB: 1966-01-01  REFERRING DOCTOR OR PCP:  [place] Pink SOURCE: patient and records  _________________________________   HISTORICAL  CHIEF COMPLAINT:  Chief Complaint  Patient presents with   Room 10    Pt is here Alone. Pt states that everything have been fine with hi MS since his last appointment. Pt states no muscle weakness. Pt states no burning or tingling in his legs.     HISTORY OF PRESENT ILLNESS:  Perry Gentry is a right handed 57 y.o. man with MS diagnosed in 2014.    Update 09/05/2022: He is on Vumerity and he tolerates it well (Tecfidera and DMF had high co-pays).    He switched insurance so needed to contact the company to keep co-pay assistance.  .   Last MRI 11/2021 ([facility]) showed no new lesions.   Lymphocytes 05/08/2022 were normal 1.6.    He denies any difficulty with gait, balance, strength .A few times with a turn he felt slightly off balanced for a second.   He sometimes uses the bannister on stairs.   He has numbness and has borderline CTS.   He sees urology for mild BPH and urinary hesitancy.  He has some urgency at times.   No incontinence.         He does note more issues with his left eye, especially  with exercising.  Vision is doing well.  He denies much fatigue.   He works as a Gaffer and travels some.   Cognition is doing well.   He has mild depression.  Pristiq had not helped and he stopped.      He is no longer exercising, hard to get motivated to do so but did in the past.     MS History:     In his 65s, he had visual changes in the left eye where he felt that was a veil over his vision.   Colors are still desaturated out of the left eye.   In 2010 he noted that when he would bend his neck forward he would have a zinging sensation going down from the spine to both of his hands. Over a couple years this improved.  He was noted numbness in his hands since the middle of  2014. Sometimes, he would drop items.  There was not any pain nor any weakness.  [name] He saw orthopedics for his hand numbness in 2014 and had an MRI of the cervical spine. The MRI showed plaque at the C2-C3 region consistent with multiple sclerosis. It also showed degenerative changes at C6-C7 with severe bilateral foraminal stenosis. A subsequent MRI of the brain showed several white matter lesions (2 periventricular, 1-2 cortical foci)  consistent with multiple sclerosis. No abnormal enhancement.   He was referred to Dr. Manuella Ghazi in [place] who diagnosed him with multiple sclerosis.   He was started on Tecfidera.   STUDIES MRI of the cervical spine 2015 showed a spinal cord plaque at C3-C4 more to the left and also has degenerative changes to the right C3-C4 and bilaterally at C6-C7.  MRI of the brain 08/05/2016 shows a few  T2/FLAIR hyperintense foci in the  white matter, most nonspecific.  [name]  No new lesions compared to 2015.  MRI brain 11/26/17 showed several T2/FLAIR hyperintense foci in the white matter of the hemispheres.  This is a nonspecific finding and could be due to chronic demyelinating plaque associated with multiple sclerosis but could also be due  to chronic microvascular ischemic change.  None of the foci appears to be acute.   There is a normal enhancement pattern and there are no acute findings.   MRI cervical spine 11/26/2017 showed abnormal signal within the spinal cord adjacent to C3-C4.  This is unchanged when compared to the 11/03/2013 MRI     Multilevel degenerative changes as detailed above.  At C3-C4, there is moderately severe right foraminal narrowing that could lead to right C4 nerve root compression.  At C6-C7, there is borderline spinal stenosis and moderately severe left foraminal narrowing that could lead to left C7 nerve root compression.  MRI of the brain 12/14/2021 showed no new lesions compared to the MRI from 2019.  NCV/EMG 12/31/2017 shows Mild left chronic C7 radiculopathy  without active features.    Borderline carpal tunnel syndromes   REVIEW OF SYSTEMS: Constitutional: No fevers, chills, sweats, or change in .   He notes some fatigue Eyes: No visual changes, double vision, eye pain Ear, nose and throat: No hearing loss, ear pain, nasal congestion, sore throat Cardiovascular: No chest pain, palpitations Respiratory:  No shortness of breath at rest or with exertion.   No wheezes GastrointestinaI: No nausea, vomiting, diarrhea, abdominal pain, fecal incontinence Genitourinary:  No dysuria, urinary retention or frequency.  No nocturia.   He has ED and decreased libido Musculoskeletal:  No neck pain, back pain Integumentary: No rash, pruritus, skin lesions Neurological: as above Psychiatric: as above Endocrine: No palpitations, diaphoresis, change in appetite, change in weigh or increased thirst Hematologic/Lymphatic:  No anemia, purpura, petechiae. Allergic/Immunologic: No itchy/runny eyes, nasal congestion, recent allergic reactions, rashes  ALLERGIES: No Known Allergies  HOME MEDICATIONS:  Current Outpatient Medications:    atorvastatin (LIPITOR) 10 MG tablet, TAKE 1 TABLET BY MOUTH EVERYDAY AT BEDTIME, Disp: 90 tablet, Rfl: 3   cholecalciferol (VITAMIN D) 1000 UNITS tablet, Take 1,000 Units by mouth daily., Disp: , Rfl:    Diroximel Fumarate (VUMERITY) 231 MG CPDR, Take 2 capsules by mouth in the morning and at bedtime., Disp: 360 capsule, Rfl: 3  PAST MEDICAL HISTORY: Past Medical History:  Diagnosis Date   Actinic keratoses    face use cream   Basal cell carcinoma    forehead Dr. Evorn Gong in 2019    COVID-19    03/05/21   Kidney stones    age 57   Multiple sclerosis ([place])    mild sxs in hand and left eye Dr. Kerman Passey neurology   Vision abnormalities     PAST SURGICAL HISTORY: Past Surgical History:  Procedure Laterality Date   CYSTOSCOPY WITH STENT PLACEMENT     LITHOTRIPSY     age 57 y.o    VASECTOMY      FAMILY HISTORY: Family  History  Problem Relation Age of Onset   Hypertension Mother    Hypertension Father    Ankylosing spondylitis Son        age 21     SOCIAL HISTORY:  Social History   Socioeconomic History   Marital status: Married    Spouse name: Not on file   Number of children: Not on file   Years of education: Not on file   Highest education level: Not on file  Occupational History   Not on file  Tobacco Use   Smoking status: Never   Smokeless tobacco: Never  Vaping Use   Vaping Use: Never used  Substance and Sexual Activity   Alcohol use: Yes    Comment: "maybe once a month"  Drug use: Not Currently   Sexual activity: Yes    Partners: Female  Other Topics Concern   Not on file  Social History Narrative   Lives at home with his wife    Right handed   2 kids 1 son and 1 daughter    DPR wife    Works Medical laboratory scientific officer for [employer]    Exercises 30 min 4 x per week    Name originates from [place]    Born in [place] Strain: Not on [employer] Insecurity: Not on file  Transportation Needs: Not on file  Physical Activity: Not on file  Stress: Not on file  Social Connections: Not on file  Intimate Partner Violence: Not on file     PHYSICAL EXAM  Vitals:   09/06/22 0825  BP: (!) 140/85  Pulse: 72  Weight: 223 lb (101.2 kg)  Height: '5\' 11"'$  (1.803 m)    Body mass index is 31.1 kg/m.   General: The patient is well-developed and well-nourished and in no acute distress   Neurologic Exam  Mental status: The patient is alert and oriented x 3 at the time of the examination. The patient has apparent normal recent and remote memory, with an apparently normal attention span and concentration ability.   Speech is normal.  Cranial nerves: Extraocular movements are full.  He has slightly reduced color vision OS.   Facial strength and sensation is normal.  Trapezius strength is normal no obvious hearing deficits are  noted.  Motor:  Muscle bulk is normal.   Muscle tone is normal.  Strength is 5/5.[name]   Sensory: Sensory testing is intact to touch and vibration in the arms and legs.  Coordination: Cerebellar testing reveals good finger-nose-finger and heel-to-shin bilaterally.  Gait and station: Station is normal.  Gait is normal.  Tandem gait is minimally wide.  Romberg is negative. .  Reflexes: Deep tendon reflexes are symmetric and normal bilaterally.        ASSESSMENT AND PLAN  Relapsing remitting multiple sclerosis (HCC)  High risk medication use  Other fatigue  History of optic neuritis   1.    He is clinically stable.  We will continue Vumerity.    He has recent blood work from primary care and the lymphocyte count was normal at 1.6. 2.   Stay active and exercise as tolerated.   3.   Continue OTC vitamin D supplementation 4.   he will return to see me in 6 months if stable but call sooner if he has any significant new or worsening neurologic symptoms   Aevah Stansbery A. Felecia Shelling, MD, PhD 12/31/8175, 1:16 AM Certified in Neurology, Clinical Neurophysiology, Sleep Medicine, Pain Medicine and Neuroimaging  [facility] [address] [place], [place] [address] [phone]

## 2022-09-25 ENCOUNTER — Other Ambulatory Visit: Payer: Self-pay | Admitting: Nurse Practitioner

## 2022-09-25 DIAGNOSIS — D696 Thrombocytopenia, unspecified: Secondary | ICD-10-CM

## 2022-10-22 ENCOUNTER — Other Ambulatory Visit (INDEPENDENT_AMBULATORY_CARE_PROVIDER_SITE_OTHER): Payer: 59

## 2022-10-22 DIAGNOSIS — D696 Thrombocytopenia, unspecified: Secondary | ICD-10-CM

## 2022-10-23 LAB — CBC WITH DIFFERENTIAL/PLATELET
Basophils Absolute: 0.1 10*3/uL (ref 0.0–0.1)
Basophils Relative: 1.3 % (ref 0.0–3.0)
Eosinophils Absolute: 0.1 10*3/uL (ref 0.0–0.7)
Eosinophils Relative: 1.9 % (ref 0.0–5.0)
HCT: 46.3 % (ref 39.0–52.0)
Hemoglobin: 15.5 g/dL (ref 13.0–17.0)
Lymphocytes Relative: 26.5 % (ref 12.0–46.0)
Lymphs Abs: 1.7 10*3/uL (ref 0.7–4.0)
MCHC: 33.4 g/dL (ref 30.0–36.0)
MCV: 86.3 fl (ref 78.0–100.0)
Monocytes Absolute: 0.4 10*3/uL (ref 0.1–1.0)
Monocytes Relative: 5.7 % (ref 3.0–12.0)
Neutro Abs: 4.2 10*3/uL (ref 1.4–7.7)
Neutrophils Relative %: 64.6 % (ref 43.0–77.0)
Platelets: 170 10*3/uL (ref 150.0–400.0)
RBC: 5.36 Mil/uL (ref 4.22–5.81)
RDW: 13.9 % (ref 11.5–15.5)
WBC: 6.6 10*3/uL (ref 4.0–10.5)

## 2022-10-23 NOTE — Addendum Note (Signed)
Addended by: Neta Ehlers on: 10/23/2022 03:27 PM   Modules accepted: Orders

## 2022-10-25 ENCOUNTER — Encounter: Payer: Self-pay | Admitting: Nurse Practitioner

## 2022-11-09 NOTE — Progress Notes (Signed)
Bethanie Dicker, NP-C Phone: [phone]  Perry Gentry is a 57 y.o. male who presents today for erectile dysfunction.  Patient reports previously seeing Urology, Dr. Artis Flock for his erectile dysfunction. He has since retired. He reports taking Sildenafil 20 mg (up to 5 tablets) PRN prior to sexual activity. He is requesting a refill on his medication and a referral to a new Urologist.   Erectile Dysfunction: Patient complains of erectile dysfunction.  Onset of dysfunction was  20  years ago.  Patient states the nature of difficulty is both attaining and maintaining erection. Full erections occur never. Partial erections occur only with Sildenafil. Libido is affected. Risk factors for ED include neurologic disease (MS). Previous treatment of ED includes Sildenafil.   Patient also reports noticing his feet and toes becoming discolored after long periods of sitting. Reports this has been going on for 3-6 months. The discoloration resolves with standing and walking. Denies any pain in his lower extremities. Denies pain with walking. Denies numbness and tingling. Denies any new wounds/ulcers on his feet.  Social History   Tobacco Use  Smoking Status Never  Smokeless Tobacco Never    Current Outpatient Medications on File Prior to Visit  Medication Sig Dispense Refill   atorvastatin (LIPITOR) 10 MG tablet TAKE 1 TABLET BY MOUTH EVERYDAY AT BEDTIME 90 tablet 3   cholecalciferol (VITAMIN D) 1000 UNITS tablet Take 1,000 Units by mouth daily.     Diroximel Fumarate (VUMERITY) 231 MG CPDR Take 2 capsules by mouth in the morning and at bedtime. 360 capsule 3   No current facility-administered medications on file prior to visit.    ROS see history of present illness  Objective  Physical Exam Vitals:   11/13/22 0831  BP: 118/70  Pulse: 84  Temp: 98.9 F (37.2 C)  SpO2: 99%    BP Readings from Last 3 Encounters:  11/13/22 118/70  09/06/22 (!) 140/85  07/26/22 122/80   Wt Readings  from Last 3 Encounters:  11/13/22 218 lb 12.8 oz (99.2 kg)  09/06/22 223 lb (101.2 kg)  07/26/22 220 lb 12.8 oz (100.2 kg)    Physical Exam Constitutional:      General: He is not in acute distress.    Appearance: Normal appearance.  HENT:     Head: Normocephalic.  Cardiovascular:     Rate and Rhythm: Normal rate and regular rhythm.     Pulses:          Dorsalis pedis pulses are 1+ on the right side and 1+ on the left side.       Posterior tibial pulses are 2+ on the right side and 2+ on the left side.     Heart sounds: Normal heart sounds.  Pulmonary:     Effort: Pulmonary effort is normal.     Breath sounds: Normal breath sounds.  Musculoskeletal:     Right foot: Normal range of motion. No deformity.     Left foot: Normal range of motion. No deformity.  Feet:     Right foot:     Skin integrity: Skin integrity normal. No skin breakdown.     Toenail Condition: Right toenails are normal.     Left foot:     Skin integrity: Skin integrity normal. No skin breakdown.     Toenail Condition: Left toenails are normal.  Skin:    General: Skin is warm and dry.  Neurological:     General: No focal deficit present.     Mental Status: He is  alert.  Psychiatric:        Mood and Affect: Mood normal.        Behavior: Behavior normal.    Assessment/Plan: Please see individual problem list.  Erectile dysfunction, unspecified erectile dysfunction type Assessment & Plan: Chronic issue. Urologist retired. Referral placed to establish with new Urologist. Refills on Sildenafil 20 mg 3-4 tablets PRN prior to sexual activity sent.   Orders: -     Ambulatory referral to Urology -     Sildenafil Citrate; Take 3-4 tablets (60-80 mg total) by mouth as needed. Prior to sexual activity.  Dispense: 100 tablet; Refill: 2  Decreased pedal pulses Assessment & Plan: Noted on exam. Tibial pulses- WNL. Will get ABI [place] for further evaluation. Will continue to monitor.   Orders: -     US ARTERIAL ABI  (SCREENING LOWER EXTREMITY); Future    Return if symptoms worsen or fail to improve.   Bethanie Dicker, NP-C [facility] [employer] [facility]

## 2022-11-13 ENCOUNTER — Encounter: Payer: Self-pay | Admitting: Nurse Practitioner

## 2022-11-13 ENCOUNTER — Ambulatory Visit: Payer: 59 | Admitting: Nurse Practitioner

## 2022-11-13 VITALS — BP 118/70 | HR 84 | Temp 98.9°F | Ht 71.0 in | Wt 218.8 lb

## 2022-11-13 DIAGNOSIS — N529 Male erectile dysfunction, unspecified: Secondary | ICD-10-CM

## 2022-11-13 DIAGNOSIS — R0989 Other specified symptoms and signs involving the circulatory and respiratory systems: Secondary | ICD-10-CM | POA: Diagnosis not present

## 2022-11-13 MED ORDER — SILDENAFIL CITRATE 20 MG PO TABS: 60.0000 mg | ORAL_TABLET | ORAL | 2 refills | Status: DC | PRN

## 2022-11-13 NOTE — Assessment & Plan Note (Signed)
Noted on exam. Tibial pulses- WNL. Will get ABI [place] for further evaluation. Will continue to monitor.

## 2022-11-13 NOTE — Assessment & Plan Note (Signed)
Chronic issue. Urologist retired. Referral placed to establish with new Urologist. Refills on Sildenafil 20 mg 3-4 tablets PRN prior to sexual activity sent.

## 2022-11-14 ENCOUNTER — Encounter: Payer: Self-pay | Admitting: Nurse Practitioner

## 2022-11-15 ENCOUNTER — Ambulatory Visit: Payer: 59

## 2022-11-27 ENCOUNTER — Encounter: Payer: Self-pay | Admitting: Nurse Practitioner

## 2022-12-11 ENCOUNTER — Other Ambulatory Visit: Payer: Self-pay | Admitting: *Deleted

## 2022-12-11 DIAGNOSIS — G35 Multiple sclerosis: Secondary | ICD-10-CM

## 2022-12-11 MED ORDER — VUMERITY 231 MG PO CPDR: 2.0000 | DELAYED_RELEASE_CAPSULE | Freq: Two times a day (BID) | ORAL | 3 refills | Status: DC

## 2023-01-16 ENCOUNTER — Encounter: Payer: Self-pay | Admitting: Urology

## 2023-01-16 ENCOUNTER — Ambulatory Visit: Payer: 59 | Admitting: Urology

## 2023-01-16 VITALS — BP 148/85 | HR 79 | Ht 71.0 in | Wt 215.0 lb

## 2023-01-16 DIAGNOSIS — N529 Male erectile dysfunction, unspecified: Secondary | ICD-10-CM | POA: Diagnosis not present

## 2023-01-16 DIAGNOSIS — Z125 Encounter for screening for malignant neoplasm of prostate: Secondary | ICD-10-CM | POA: Diagnosis not present

## 2023-01-16 MED ORDER — SILDENAFIL CITRATE 100 MG PO TABS: ORAL_TABLET | ORAL | 0 refills | Status: AC

## 2023-01-16 NOTE — Progress Notes (Signed)
Albin Fischer Abdulla,acting as a scribe for Riki Altes, MD.,have documented all relevant documentation on the behalf of Riki Altes, MD,as directed by  Riki Altes, MD while in the presence of Riki Altes, MD.   01/16/2023 9:40 AM   Perry Gentry 1966/05/15 [id number]  Referring provider: Bethanie Dicker, NP [address] [place],  [place] [address]  Chief Complaint  Patient presents with   Erectile Dysfunction    HPI: Perry Gentry is a 57 y.o. male presenting for evaluation of erectile dysfunction.  Prior patient of Dr. Evelene Croon. This was not relayed in the referral, and his previous records were not requested. Using Sildenafil 80 mg 1 hour prior to intercourse with good efficacy. States he has not needed to titrate to 100 mg. No bothersome LUTS. Denies dysuria, gross hematuria. Denies flank, abdominal or pelvic pain.   PMH: Past Medical History:  Diagnosis Date   Actinic keratoses    face use cream   Basal cell carcinoma    forehead Dr. Adolphus Birchwood in 2019    COVID-19    03/05/21   Kidney stones    age 36   Multiple sclerosis (HCC)    mild sxs in hand and left eye Dr. Leilani Merl neurology   Vision abnormalities     Surgical History: Past Surgical History:  Procedure Laterality Date   CYSTOSCOPY WITH STENT PLACEMENT     LITHOTRIPSY     age 41 y.o    VASECTOMY      Home Medications:  Allergies as of 01/16/2023   No Known Allergies      Medication List        Accurate as of January 16, 2023  9:40 AM. If you have any questions, ask your nurse or doctor.          STOP taking these medications    sildenafil 20 MG tablet Commonly known as: REVATIO Stopped by: Riki Altes, MD       TAKE these medications    atorvastatin 10 MG tablet Commonly known as: LIPITOR TAKE 1 TABLET BY MOUTH EVERYDAY AT BEDTIME   cholecalciferol 1000 units tablet Commonly known as: VITAMIN D Take 1,000 Units by mouth daily.   sildenafil 100 MG  tablet Commonly known as: VIAGRA Take 1 tab 1 hour prior to intercourse Started by: Riki Altes, MD   Vumerity 231 MG Cpdr Generic drug: Diroximel Fumarate Take 2 capsules by mouth in the morning and at bedtime.        Family History: Family History  Problem Relation Age of Onset   Hypertension Mother    Hypertension Father    Ankylosing spondylitis Son        age 72     Social History:  reports that he has never smoked. He has never used smokeless tobacco. He reports current alcohol use. He reports that he does not currently use drugs.   Physical Exam: BP (!) 148/85   Pulse 79   Ht 5\' 11"  (1.803 m)   Wt 215 lb (97.5 kg)   BMI 29.99 kg/m   Constitutional:  Alert and oriented, No acute distress. HEENT: [place] AT Respiratory: Normal respiratory effort, no increased work of breathing. Psychiatric: Normal mood and affect.   Assessment & Plan:    1. Erectile dysfunction We discussed Sildenafil 100 mg is available in a single tablet and may be easier to take and is less expensive using [facility]. He requested RX, which was sent to  pharmacy. Annual follow up.   2. Prostate cancer screening Patient states he has had annual PSA. After he left, chart was reviewed, and his last PSA was May 2023, which was 1.51. He will be contacted to see if he wants a follow up PSA here or with his PCP.  I have reviewed the above documentation for accuracy and completeness, and I agree with the above.   Riki Altes, MD  [facility] [address], [address] [place], [place] [address] [phone]

## 2023-01-26 ENCOUNTER — Encounter: Payer: Self-pay | Admitting: Nurse Practitioner

## 2023-03-07 ENCOUNTER — Ambulatory Visit: Payer: 59 | Admitting: Neurology

## 2023-04-18 ENCOUNTER — Telehealth: Payer: Self-pay | Admitting: Nurse Practitioner

## 2023-04-18 DIAGNOSIS — E785 Hyperlipidemia, unspecified: Secondary | ICD-10-CM

## 2023-04-18 NOTE — Telephone Encounter (Signed)
Prescription Request  04/18/2023  LOV: 11/13/2022  What is the name of the medication or equipment? atorvastatin   Have you contacted your pharmacy to request a refill? yes  [facility]   Patient notified that their request is being sent to the clinical staff for review and that they should receive a response within 2 business days.   Please advise at Mobile [phone] (mobile)

## 2023-04-19 MED ORDER — ATORVASTATIN CALCIUM 10 MG PO TABS
ORAL_TABLET | ORAL | 3 refills | Status: DC
Start: 2023-04-19 — End: 2024-04-13

## 2023-04-19 NOTE — Telephone Encounter (Signed)
Called [facility] to clarify which [facility] because front staff message was not specific on which [facility] to [facility] , pt informed me [facility] [address] [facility] [address] refill has been sent

## 2023-05-20 ENCOUNTER — Telehealth: Payer: Self-pay

## 2023-05-20 ENCOUNTER — Other Ambulatory Visit (HOSPITAL_COMMUNITY): Payer: Self-pay

## 2023-05-20 DIAGNOSIS — G35 Multiple sclerosis: Secondary | ICD-10-CM

## 2023-05-20 NOTE — Telephone Encounter (Signed)
Pharmacy Patient Advocate Encounter   Received notification from CoverMyMeds that prior authorization for Vumerity 231MG  delayed release capsules is required/requested.   Insurance verification completed.   The patient is insured through [employer] .   Per test claim: PA required; PA submitted to [employer] via Fax Key/confirmation #/EOC N/A Status is pending  Faxed completed form along with clinicals and labs to [phone]. Awaiting determination.

## 2023-05-22 ENCOUNTER — Telehealth: Payer: Self-pay | Admitting: Neurology

## 2023-05-24 NOTE — Telephone Encounter (Signed)
Monica, would not like to switch. He was previously on Tecfidera. Not covered by insurance eventually and tried to change to dimethyl fumarate but copay was too high. This was the reason he changed to Vumerity 10/2019. He is stable on therapy and tolerating well. Changing medication will put him at risk of relapse and/or further disability.

## 2023-05-24 NOTE — Telephone Encounter (Signed)
      Received this fax-Please advise.

## 2023-05-28 NOTE — Telephone Encounter (Signed)
Error

## 2023-06-03 ENCOUNTER — Other Ambulatory Visit (HOSPITAL_COMMUNITY): Payer: Self-pay

## 2023-06-03 NOTE — Telephone Encounter (Signed)
Form completed and faxed back to [phone]. Awaiting determination.

## 2023-06-05 NOTE — Telephone Encounter (Signed)
Pharmacy Patient Advocate Encounter  Received notification from [facility] that Prior Authorization for Vumerity has been DENIED.  Full denial letter will be uploaded to the media tab. See denial reason below.   PA #/Case ID/Reference #: N/A-was not provided via denial letter

## 2023-06-05 NOTE — Telephone Encounter (Signed)
Dr. Epimenio Foot- any reason he cannot try formulary options below?

## 2023-06-17 NOTE — Telephone Encounter (Signed)
Received signed consent from pt. Faxed appeal re Vumerity to [phone]. Received fax confirmation.Waiting on determination.

## 2023-06-24 MED ORDER — VUMERITY 231 MG PO CPDR
2.0000 | DELAYED_RELEASE_CAPSULE | Freq: Two times a day (BID) | ORAL | 3 refills | Status: DC
Start: 2023-06-24 — End: 2024-06-09

## 2023-06-24 NOTE — Telephone Encounter (Signed)
Faxed signed rx to [facility] at [phone]. Received fax confirmation.

## 2023-06-24 NOTE — Addendum Note (Signed)
Addended by: Arther Abbott on: 06/24/2023 08:43 AM   Modules accepted: Orders

## 2023-07-09 DIAGNOSIS — L570 Actinic keratosis: Secondary | ICD-10-CM | POA: Diagnosis not present

## 2023-07-09 DIAGNOSIS — X32XXXA Exposure to sunlight, initial encounter: Secondary | ICD-10-CM | POA: Diagnosis not present

## 2023-08-16 ENCOUNTER — Ambulatory Visit: Payer: Federal, State, Local not specified - PPO | Admitting: Neurology

## 2023-08-16 ENCOUNTER — Encounter: Payer: Self-pay | Admitting: Neurology

## 2023-08-16 VITALS — BP 128/79 | HR 79 | Ht 71.0 in | Wt 222.0 lb

## 2023-08-16 DIAGNOSIS — R200 Anesthesia of skin: Secondary | ICD-10-CM

## 2023-08-16 DIAGNOSIS — R5383 Other fatigue: Secondary | ICD-10-CM | POA: Diagnosis not present

## 2023-08-16 DIAGNOSIS — Z8669 Personal history of other diseases of the nervous system and sense organs: Secondary | ICD-10-CM | POA: Diagnosis not present

## 2023-08-16 DIAGNOSIS — G35D Multiple sclerosis, unspecified: Secondary | ICD-10-CM

## 2023-08-16 DIAGNOSIS — Z79899 Other long term (current) drug therapy: Secondary | ICD-10-CM | POA: Diagnosis not present

## 2023-08-16 DIAGNOSIS — G35 Multiple sclerosis: Secondary | ICD-10-CM | POA: Diagnosis not present

## 2023-08-16 NOTE — Progress Notes (Signed)
[facility]  PATIENT: Perry Gentry DOB: [birth year]  REFERRING DOCTOR OR PCP:  Jerl Mina SOURCE: patient and records  _________________________________   HISTORICAL  CHIEF COMPLAINT:  Chief Complaint  Patient presents with   Room 10    Pt is here Alone. Pt denies any new symptoms or concerns to discuss today. Pt states that he has been doing fine with his MS since last Appointment. Pt is tolerating Vumerity well.     HISTORY OF PRESENT ILLNESS:  Perry Gentry is a right handed 58 y.o. man with MS diagnosed in 2014.    Update 08/16/2023: He is on Vumerity and he tolerates it well (Tecfidera and DMF had high co-pays). He is stable with no new neurologic issues and no exacerbations.   Last MRI 11/2021 ([facility]) showed no new lesions.   Lymphocytes 10/23/2022 were normal 1.7  He denies any difficulty with gait, balance, strength .A few times with a turn he felt slightly off balanced for a second.  He has no issues on stairs - occ uses bannister.   He has hand numbness but has borderline bilateral CTS.   He sees urology for mild BPH and urinary hesitancy.  He has some urgency at times.   No incontinence.       Takes prn viagra  He does note more issues with his left eye, especially  with exercising.  Vision is doing well.  He denies much fatigue.  Sleep is variable.     He works as a Systems analyst and travels some.   Cognition is doing well.   He has mild depression.  Pristiq had not helped and he stopped.      He iused to exercise but has sropped and gained a few pounds.      MS History:     In his 49s, he had visual changes in the left eye where he felt that was a veil over his vision.   Colors are still desaturated out of the left eye.   In 2010 he noted that when he would bend his neck forward he would have a zinging sensation going down from the spine to both of his hands. Over a couple years this improved.  He was noted numbness in his hands  since the middle of 2014. Sometimes, he would drop items.  There was not any pain nor any weakness.  [name] He saw orthopedics for his hand numbness in 2014 and had an MRI of the cervical spine. The MRI showed plaque at the C2-C3 region consistent with multiple sclerosis. It also showed degenerative changes at C6-C7 with severe bilateral foraminal stenosis. A subsequent MRI of the brain showed several white matter lesions (2 periventricular, 1-2 cortical foci)  consistent with multiple sclerosis. No abnormal enhancement.   He was referred to Dr. Sherryll Burger in [place] who diagnosed him with multiple sclerosis.   He was started on Tecfidera.   STUDIES MRI of the cervical spine 2015 showed a spinal cord plaque at C3-C4 more to the left and also has degenerative changes to the right C3-C4 and bilaterally at C6-C7.  MRI of the brain 08/05/2016 shows a few  T2/FLAIR hyperintense foci in the  white matter, most nonspecific.  [name]  No new lesions compared to 2015.  MRI brain 11/26/17 showed several T2/FLAIR hyperintense foci in the white matter of the hemispheres.  This is a nonspecific finding and could be due to chronic demyelinating plaque associated with multiple sclerosis but could also be due  to chronic microvascular ischemic change.  None of the foci appears to be acute.   There is a normal enhancement pattern and there are no acute findings.   MRI cervical spine 11/26/2017 showed abnormal signal within the spinal cord adjacent to C3-C4.  This is unchanged when compared to the 11/03/2013 MRI     Multilevel degenerative changes as detailed above.  At C3-C4, there is moderately severe right foraminal narrowing that could lead to right C4 nerve root compression.  At C6-C7, there is borderline spinal stenosis and moderately severe left foraminal narrowing that could lead to left C7 nerve root compression.  MRI of the brain 12/14/2021 showed no new lesions compared to the MRI from 2019.  NCV/EMG 12/31/2017 shows Mild left  chronic C7 radiculopathy without active features.    Borderline carpal tunnel syndromes   REVIEW OF SYSTEMS: Constitutional: No fevers, chills, sweats, or change in .   He notes some fatigue Eyes: No visual changes, double vision, eye pain Ear, nose and throat: No hearing loss, ear pain, nasal congestion, sore throat Cardiovascular: No chest pain, palpitations Respiratory:  No shortness of breath at rest or with exertion.   No wheezes GastrointestinaI: No nausea, vomiting, diarrhea, abdominal pain, fecal incontinence Genitourinary:  No dysuria, urinary retention or frequency.  No nocturia.   He has ED and decreased libido Musculoskeletal:  No neck pain, back pain Integumentary: No rash, pruritus, skin lesions Neurological: as above Psychiatric: as above Endocrine: No palpitations, diaphoresis, change in appetite, change in weigh or increased thirst Hematologic/Lymphatic:  No anemia, purpura, petechiae. Allergic/Immunologic: No itchy/runny eyes, nasal congestion, recent allergic reactions, rashes  ALLERGIES: No Known Allergies  HOME MEDICATIONS:  Current Outpatient Medications:    atorvastatin (LIPITOR) 10 MG tablet, TAKE 1 TABLET BY MOUTH EVERYDAY AT BEDTIME, Disp: 90 tablet, Rfl: 3   cholecalciferol (VITAMIN D) 1000 UNITS tablet, Take 1,000 Units by mouth daily., Disp: , Rfl:    Diroximel Fumarate (VUMERITY) 231 MG CPDR, Take 2 capsules by mouth in the morning and at bedtime., Disp: 360 capsule, Rfl: 3   sildenafil (VIAGRA) 100 MG tablet, Take 1 tab 1 hour prior to intercourse, Disp: 90 tablet, Rfl: 0  PAST MEDICAL HISTORY: Past Medical History:  Diagnosis Date   Actinic keratoses    face use cream   Basal cell carcinoma    forehead Dr. Adolphus Birchwood in 2019    COVID-19    03/05/21   Kidney stones    age 51   Multiple sclerosis (HCC)    mild sxs in hand and left eye Dr. Leilani Merl neurology   Vision abnormalities     PAST SURGICAL HISTORY: Past Surgical History:  Procedure  Laterality Date   CYSTOSCOPY WITH STENT PLACEMENT     LITHOTRIPSY     age 72 y.o    VASECTOMY      FAMILY HISTORY: Family History  Problem Relation Age of Onset   Hypertension Mother    Hypertension Father    Ankylosing spondylitis Son        age 106     SOCIAL HISTORY:  Social History   Socioeconomic History   Marital status: Married    Spouse name: Not on file   Number of children: Not on file   Years of education: Not on file   Highest education level: Bachelor's degree (e.g., BA, AB, BS)  Occupational History   Not on file  Tobacco Use   Smoking status: Never   Smokeless tobacco: Never  Vaping Use  Vaping status: Never Used  Substance and Sexual Activity   Alcohol use: Yes    Comment: "maybe once a month"   Drug use: Not Currently   Sexual activity: Yes    Partners: Female  Other Topics Concern   Not on file  Social History Narrative   Lives at home with his wife    Right handed   2 kids 1 son and 1 daughter    DPR wife    Works Public affairs consultant for [employer]    Exercises 30 min 4 x per week    Name originates from [place]    Born in [place]    Social Drivers of Health   Financial Resource Strain: Low Risk  (11/11/2022)   Overall Financial Resource Strain (CARDIA)    Difficulty of Paying Living Expenses: Not hard at all  Food Insecurity: No Food Insecurity (11/11/2022)   Hunger Vital Sign    Worried About Running Out of Food in the Last Year: Never true    Ran Out of Food in the Last Year: Never true  Transportation Needs: No Transportation Needs (11/11/2022)   PRAPARE - Administrator, Civil Service (Medical): No    Lack of Transportation (Non-Medical): No  Physical Activity: Insufficiently Active (11/11/2022)   Exercise Vital Sign    Days of Exercise per Week: 4 days    Minutes of Exercise per Session: 10 min  Stress: Stress Concern Present (11/11/2022)   [employer] of Occupational Health - Occupational Stress  Questionnaire    Feeling of Stress : To some extent  Social Connections: Moderately Isolated (11/11/2022)   Social Connection and Isolation Panel [NHANES]    Frequency of Communication with Friends and Family: Three times a week    Frequency of Social Gatherings with Friends and Family: Once a week    Attends Religious Services: Never    Database administrator or Organizations: No    Attends Engineer, structural: Not on file    Marital Status: Married  Catering manager Violence: Not on file     PHYSICAL EXAM  Vitals:   08/16/23 1112  BP: 128/79  Pulse: 79  Weight: 222 lb (100.7 kg)  Height: 5\' 11"  (1.803 m)    Body mass index is 30.96 kg/m.   General: The patient is well-developed and well-nourished and in no acute distress   Neurologic Exam  Mental status: The patient is alert and oriented x 3 at the time of the examination. The patient has apparent normal recent and remote memory, with an apparently normal attention span and concentration ability.   Speech is normal.  Cranial nerves: Extraocular movements are full.  He has slightly reduced color vision OS.   Facial strength and sensation is normal.  Trapezius strength is normal no obvious hearing deficits are noted.  Motor:  Muscle bulk is normal.   Muscle tone is normal.  Strength is 5/5.[name]   Sensory: Sensory testing is intact to touch and vibration in the arms and legs.  Coordination: Cerebellar testing reveals good finger-nose-finger and heel-to-shin bilaterally.  Gait and station: Station is normal.  Gait is normal.  Tandem gait is minimally wide.  The Romberg is negative. Reflexes: Deep tendon reflexes are symmetric and normal bilaterally.        ASSESSMENT AND PLAN  Multiple sclerosis (HCC)  High risk medication use  Other fatigue  History of optic neuritis  Hand numbness   1.    His MS continues  to do well on Vumerity.  He denies any exacerbations or new neurologic symptoms.  We will  continue Vumerity for now.  We did discuss that given his age, it is possible that he will be able to come off of disease modifying therapies in the 61s.  1 other strategy would be to have him do 2 years of Mavenclad as a benefit can often last 8 years or longer.  He is going to give this more thought.  If he chooses this option I would recommend that he get his shingles vaccine before we make a change.     2.   Stay active and exercise as tolerated.   3.   Continue OTC vitamin D supplementation 4.   he will return to see me in 6 months if stable but call sooner if he has any significant new or worsening neurologic symptoms   Danamarie Minami A. Epimenio Foot, MD, PhD 08/16/2023, 11:24 AM Certified in Neurology, Clinical Neurophysiology, Sleep Medicine, Pain Medicine and Neuroimaging  [facility] [address], [address] [place], [place] [address] [phone]

## 2023-08-17 ENCOUNTER — Encounter: Payer: Self-pay | Admitting: Neurology

## 2023-08-17 LAB — CBC WITH DIFFERENTIAL/PLATELET
Basophils Absolute: 0 10*3/uL (ref 0.0–0.2)
Basos: 1 %
EOS (ABSOLUTE): 0.1 10*3/uL (ref 0.0–0.4)
Eos: 2 %
Hematocrit: 46.1 % (ref 37.5–51.0)
Hemoglobin: 15.1 g/dL (ref 13.0–17.7)
Immature Grans (Abs): 0 10*3/uL (ref 0.0–0.1)
Immature Granulocytes: 0 %
Lymphocytes Absolute: 1.5 10*3/uL (ref 0.7–3.1)
Lymphs: 21 %
MCH: 28.3 pg (ref 26.6–33.0)
MCHC: 32.8 g/dL (ref 31.5–35.7)
MCV: 86 fL (ref 79–97)
Monocytes Absolute: 0.5 10*3/uL (ref 0.1–0.9)
Monocytes: 7 %
Neutrophils Absolute: 4.9 10*3/uL (ref 1.4–7.0)
Neutrophils: 69 %
Platelets: 169 10*3/uL (ref 150–450)
RBC: 5.34 x10E6/uL (ref 4.14–5.80)
RDW: 12.7 % (ref 11.6–15.4)
WBC: 7 10*3/uL (ref 3.4–10.8)

## 2024-01-16 ENCOUNTER — Ambulatory Visit: Payer: Self-pay | Admitting: Urology

## 2024-03-05 ENCOUNTER — Ambulatory Visit: Payer: Federal, State, Local not specified - PPO | Admitting: Neurology

## 2024-03-05 ENCOUNTER — Telehealth: Payer: Self-pay | Admitting: Neurology

## 2024-03-05 ENCOUNTER — Encounter: Payer: Self-pay | Admitting: Neurology

## 2024-03-05 NOTE — Telephone Encounter (Signed)
Patient cancelled appointment due to scheduling conflict.

## 2024-03-18 ENCOUNTER — Ambulatory Visit: Admitting: Urology

## 2024-04-13 ENCOUNTER — Other Ambulatory Visit: Payer: Self-pay | Admitting: Nurse Practitioner

## 2024-04-13 DIAGNOSIS — E785 Hyperlipidemia, unspecified: Secondary | ICD-10-CM

## 2024-04-14 ENCOUNTER — Ambulatory Visit: Admitting: Urology

## 2024-04-22 ENCOUNTER — Ambulatory Visit: Admitting: Nurse Practitioner

## 2024-04-22 ENCOUNTER — Encounter: Payer: Self-pay | Admitting: Nurse Practitioner

## 2024-04-22 VITALS — BP 124/70 | HR 74 | Temp 98.0°F | Ht 71.0 in | Wt 223.0 lb

## 2024-04-22 DIAGNOSIS — G35 Multiple sclerosis: Secondary | ICD-10-CM

## 2024-04-22 DIAGNOSIS — Z1211 Encounter for screening for malignant neoplasm of colon: Secondary | ICD-10-CM

## 2024-04-22 DIAGNOSIS — G4700 Insomnia, unspecified: Secondary | ICD-10-CM | POA: Diagnosis not present

## 2024-04-22 DIAGNOSIS — E785 Hyperlipidemia, unspecified: Secondary | ICD-10-CM | POA: Diagnosis not present

## 2024-04-22 DIAGNOSIS — Z0001 Encounter for general adult medical examination with abnormal findings: Secondary | ICD-10-CM

## 2024-04-22 DIAGNOSIS — Z1329 Encounter for screening for other suspected endocrine disorder: Secondary | ICD-10-CM

## 2024-04-22 DIAGNOSIS — E669 Obesity, unspecified: Secondary | ICD-10-CM

## 2024-04-22 DIAGNOSIS — Z125 Encounter for screening for malignant neoplasm of prostate: Secondary | ICD-10-CM

## 2024-04-22 MED ORDER — TRAZODONE HCL 50 MG PO TABS: 25.0000 mg | ORAL_TABLET | Freq: Every evening | ORAL | 3 refills | Status: DC | PRN

## 2024-04-22 NOTE — Progress Notes (Signed)
 Perry Glance, NP-C Phone: [phone]  Perry Gentry is a 58 y.o. male who presents today for annual exam.   Discussed the use of AI scribe software for clinical note transcription with the patient, who gave verbal consent to proceed.  History of Present Illness   Perry Gentry is a 58 year old male who presents for annual exam with sleep disturbances.  He experiences difficulty staying asleep, characterized by waking up, turning over, and falling back asleep multiple times each night. He falls asleep easily around 10 PM but struggles to maintain sleep throughout the night. Despite these disturbances, he does not feel excessively tired during the day and does not believe he is missing rest. He has tried over-the-counter remedies such as melatonin, magnesium glycinate, and half a Benadryl without success.  He wants to lose weight, aiming to reduce his weight by about 30 pounds. He plans to increase his physical activity and improve his diet by eating smaller portions and incorporating more vegetables, although he admits to not liking many vegetables. Frequent work travel impacts his ability to maintain a consistent diet and exercise routine.  He takes Lipitor for cholesterol and vitamin D  supplements. He does not smoke and consumes alcohol socially about once a month. No significant family history of colon or prostate cancer.  No chest pain, shortness of breath, abdominal pain, urinary issues at night, headaches, dizziness, trouble swallowing, skin changes, joint pain, mood issues, or significant anxiety or depression. He notes occasional urgency with urination but no nocturia. His daughter has commented on his loud breathing, but he does not feel short of breath or have difficulty breathing.      Social History   Tobacco Use  Smoking Status Never  Smokeless Tobacco Never    Current Outpatient Medications on File Prior to Visit  Medication Sig Dispense Refill   atorvastatin   (LIPITOR) 10 MG tablet TAKE 1 TABLET BY MOUTH EVERYDAY AT BEDTIME 90 tablet 3   cholecalciferol (VITAMIN D ) 1000 UNITS tablet Take 1,000 Units by mouth daily.     Diroximel Fumarate  (VUMERITY ) 231 MG CPDR Take 2 capsules by mouth in the morning and at bedtime. 360 capsule 3   sildenafil  (VIAGRA ) 100 MG tablet Take 1 tab 1 hour prior to intercourse 90 tablet 0   No current facility-administered medications on file prior to visit.     ROS see history of present illness  Objective  Physical Exam Vitals:   04/22/24 1100  BP: 124/70  Pulse: 74  Temp: 98 F (36.7 C)  SpO2: 99%    BP Readings from Last 3 Encounters:  04/22/24 124/70  08/16/23 128/79  01/16/23 (!) 148/85   Wt Readings from Last 3 Encounters:  04/22/24 223 lb (101.2 kg)  08/16/23 222 lb (100.7 kg)  01/16/23 215 lb (97.5 kg)    Physical Exam Constitutional:      General: He is not in acute distress.    Appearance: Normal appearance.  HENT:     Head: Normocephalic.     Right Ear: Tympanic membrane normal.     Left Ear: Tympanic membrane normal.     Nose: Nose normal.     Mouth/Throat:     Mouth: Mucous membranes are moist.     Pharynx: Oropharynx is clear.  Eyes:     Conjunctiva/sclera: Conjunctivae normal.     Pupils: Pupils are equal, round, and reactive to light.  Neck:     Thyroid: No thyromegaly.  Cardiovascular:     Rate and Rhythm: Normal  rate and regular rhythm.     Heart sounds: Normal heart sounds.  Pulmonary:     Effort: Pulmonary effort is normal.     Breath sounds: Normal breath sounds.  Abdominal:     General: Abdomen is flat. Bowel sounds are normal.     Palpations: Abdomen is soft. There is no mass.     Tenderness: There is no abdominal tenderness.  Musculoskeletal:        General: Normal range of motion.  Lymphadenopathy:     Cervical: No cervical adenopathy.  Skin:    General: Skin is warm and dry.     Findings: No rash.  Neurological:     General: No focal deficit present.      Mental Status: He is alert.  Psychiatric:        Mood and Affect: Mood normal.        Behavior: Behavior normal.      Assessment/Plan: Please see individual problem list.  Encounter for routine adult health examination with abnormal findings Assessment & Plan: Physical exam complete. He will return to complete fasting lab work as outlined. PSA screening ordered in labs. Colon cancer screening options discussed, opted for Cologuard. Order placed, encouraged to complete when it arrives. He declines the flu shot tetanus vaccine and additional COVID vaccines. He is interested in the shingles vaccine, advised to return for nurse visit or get at local pharmacy. Continue routine dental and eye exams. Encourage healthy diet and regular exercise. Return to care for lab work, then in one year, sooner as needed.    Insomnia, unspecified type Assessment & Plan: He has chronic difficulty staying asleep and has not responded to melatonin and magnesium glycinate. Prescribe trazodone  50 mg, starting with half a tablet at bedtime and increasing to a full tablet if needed. Consider a sleep study if trazodone  is ineffective. Counseled on sleep hygiene. We will continue to monitor.   Orders: -     IBC + Ferritin; Future -     traZODone  HCl; Take 0.5-1 tablets (25-50 mg total) by mouth at bedtime as needed for sleep.  Dispense: 90 tablet; Refill: 3  Relapsing remitting multiple sclerosis Assessment & Plan: Stable at this time. Continue current regimen. Follow up with Neurology- Dr. Vear as scheduled.   Orders: -     CBC with Differential/Platelet; Future -     Comprehensive metabolic panel with GFR; Future -     VITAMIN D  25 Hydroxy (Vit-D Deficiency, Fractures); Future  Hyperlipidemia, unspecified hyperlipidemia type Assessment & Plan: Managed with lipitor 10 mg daily. Continue. Check fasting lipid panel.   Orders: -     Lipid panel; Future  Obesity (BMI 30-39.9) Assessment & Plan: Check  A1c. Encourage healthy diet and regular exercise.   Orders: -     Hemoglobin A1c; Future  Thyroid disorder screen -     TSH; Future  Screening PSA (prostate specific antigen) -     PSA; Future  Screen for colon cancer -     Cologuard      Return for fasting labs then in one year for Annual Exam, sooner as needed.   Perry Glance, NP-C [facility]

## 2024-04-23 ENCOUNTER — Other Ambulatory Visit (INDEPENDENT_AMBULATORY_CARE_PROVIDER_SITE_OTHER)

## 2024-04-23 DIAGNOSIS — E669 Obesity, unspecified: Secondary | ICD-10-CM

## 2024-04-23 DIAGNOSIS — E785 Hyperlipidemia, unspecified: Secondary | ICD-10-CM | POA: Diagnosis not present

## 2024-04-23 DIAGNOSIS — Z1329 Encounter for screening for other suspected endocrine disorder: Secondary | ICD-10-CM | POA: Diagnosis not present

## 2024-04-23 DIAGNOSIS — G35 Multiple sclerosis: Secondary | ICD-10-CM

## 2024-04-23 DIAGNOSIS — Z125 Encounter for screening for malignant neoplasm of prostate: Secondary | ICD-10-CM

## 2024-04-23 DIAGNOSIS — G4700 Insomnia, unspecified: Secondary | ICD-10-CM

## 2024-04-23 LAB — COMPREHENSIVE METABOLIC PANEL WITH GFR
ALT: 28 U/L (ref 0–53)
AST: 16 U/L (ref 0–37)
Albumin: 4.6 g/dL (ref 3.5–5.2)
Alkaline Phosphatase: 68 U/L (ref 39–117)
BUN: 13 mg/dL (ref 6–23)
CO2: 28 meq/L (ref 19–32)
Calcium: 9.8 mg/dL (ref 8.4–10.5)
Chloride: 105 meq/L (ref 96–112)
Creatinine, Ser: 1.04 mg/dL (ref 0.40–1.50)
GFR: 78.78 mL/min (ref >60.00)
Glucose, Bld: 92 mg/dL (ref 70–99)
Potassium: 4.6 meq/L (ref 3.5–5.1)
Sodium: 142 meq/L (ref 135–145)
Total Bilirubin: 0.5 mg/dL (ref 0.2–1.2)
Total Protein: 7.3 g/dL (ref 6.0–8.3)

## 2024-04-23 LAB — IBC + FERRITIN
Ferritin: 25.7 ng/mL (ref 22.0–322.0)
Iron: 70 ug/dL (ref 42–165)
Saturation Ratios: 18.6 % — ABNORMAL LOW (ref 20.0–50.0)
TIBC: 376.6 ug/dL (ref 250.0–450.0)
Transferrin: 269 mg/dL (ref 212.0–360.0)

## 2024-04-23 LAB — CBC WITH DIFFERENTIAL/PLATELET
Basophils Absolute: 0 K/uL (ref 0.0–0.1)
Basophils Relative: 0.7 % (ref 0.0–3.0)
Eosinophils Absolute: 0.1 K/uL (ref 0.0–0.7)
Eosinophils Relative: 2.4 % (ref 0.0–5.0)
HCT: 44.1 % (ref 39.0–52.0)
Hemoglobin: 15 g/dL (ref 13.0–17.0)
Lymphocytes Relative: 26 % (ref 12.0–46.0)
Lymphs Abs: 1.5 K/uL (ref 0.7–4.0)
MCHC: 33.9 g/dL (ref 30.0–36.0)
MCV: 84.8 fl (ref 78.0–100.0)
Monocytes Absolute: 0.5 K/uL (ref 0.1–1.0)
Monocytes Relative: 8.4 % (ref 3.0–12.0)
Neutro Abs: 3.6 K/uL (ref 1.4–7.7)
Neutrophils Relative %: 62.5 % (ref 43.0–77.0)
Platelets: 150 K/uL (ref 150.0–400.0)
RBC: 5.2 Mil/uL (ref 4.22–5.81)
RDW: 13.2 % (ref 11.5–15.5)
WBC: 5.7 K/uL (ref 4.0–10.5)

## 2024-04-23 LAB — TSH: TSH: 1.97 u[IU]/mL (ref 0.35–5.50)

## 2024-04-23 LAB — HEMOGLOBIN A1C: Hgb A1c MFr Bld: 5.9 % (ref 4.6–6.5)

## 2024-04-23 LAB — LIPID PANEL
Cholesterol: 162 mg/dL (ref 0–200)
HDL: 48.9 mg/dL (ref >39.00)
LDL Cholesterol: 96 mg/dL (ref 0–99)
NonHDL: 113.58
Total CHOL/HDL Ratio: 3
Triglycerides: 87 mg/dL (ref 0.0–149.0)
VLDL: 17.4 mg/dL (ref 0.0–40.0)

## 2024-04-23 LAB — PSA: PSA: 0.8 ng/mL (ref 0.10–4.00)

## 2024-04-23 LAB — VITAMIN D 25 HYDROXY (VIT D DEFICIENCY, FRACTURES): VITD: 24.2 ng/mL — ABNORMAL LOW (ref 30.00–100.00)

## 2024-04-29 ENCOUNTER — Encounter: Payer: Self-pay | Admitting: Nurse Practitioner

## 2024-04-30 ENCOUNTER — Ambulatory Visit: Payer: Self-pay | Admitting: Nurse Practitioner

## 2024-05-07 LAB — COLOGUARD: COLOGUARD: NEGATIVE

## 2024-05-12 ENCOUNTER — Encounter: Payer: Self-pay | Admitting: Nurse Practitioner

## 2024-05-12 DIAGNOSIS — Z0001 Encounter for general adult medical examination with abnormal findings: Secondary | ICD-10-CM | POA: Insufficient documentation

## 2024-05-12 DIAGNOSIS — E669 Obesity, unspecified: Secondary | ICD-10-CM | POA: Insufficient documentation

## 2024-05-12 NOTE — Assessment & Plan Note (Signed)
 Stable at this time. Continue current regimen. Follow up with Neurology- Dr. Vear as scheduled.

## 2024-05-12 NOTE — Assessment & Plan Note (Signed)
 Check A1c. Encourage healthy diet and regular exercise.

## 2024-05-12 NOTE — Assessment & Plan Note (Signed)
 Physical exam complete. He will return to complete fasting lab work as outlined. PSA screening ordered in labs. Colon cancer screening options discussed, opted for Cologuard. Order placed, encouraged to complete when it arrives. He declines the flu shot tetanus vaccine and additional COVID vaccines. He is interested in the shingles vaccine, advised to return for nurse visit or get at local pharmacy. Continue routine dental and eye exams. Encourage healthy diet and regular exercise. Return to care for lab work, then in one year, sooner as needed.

## 2024-05-12 NOTE — Assessment & Plan Note (Signed)
 Managed with lipitor 10 mg daily. Continue. Check fasting lipid panel.

## 2024-05-12 NOTE — Assessment & Plan Note (Signed)
 He has chronic difficulty staying asleep and has not responded to melatonin and magnesium glycinate. Prescribe trazodone  50 mg, starting with half a tablet at bedtime and increasing to a full tablet if needed. Consider a sleep study if trazodone  is ineffective. Counseled on sleep hygiene. We will continue to monitor.

## 2024-06-06 ENCOUNTER — Encounter: Payer: Self-pay | Admitting: Neurology

## 2024-06-06 DIAGNOSIS — G35D Multiple sclerosis, unspecified: Secondary | ICD-10-CM

## 2024-06-09 MED ORDER — VUMERITY 231 MG PO CPDR: 2.0000 | DELAYED_RELEASE_CAPSULE | Freq: Two times a day (BID) | ORAL | 3 refills | Status: DC

## 2024-06-30 ENCOUNTER — Other Ambulatory Visit (HOSPITAL_COMMUNITY): Payer: Self-pay

## 2024-06-30 ENCOUNTER — Telehealth: Payer: Self-pay

## 2024-06-30 NOTE — Telephone Encounter (Signed)
   PA cannot be submitted via CMM or Latent-I will outreach plan to submit PA.

## 2024-07-01 ENCOUNTER — Other Ambulatory Visit (HOSPITAL_COMMUNITY): Payer: Self-pay

## 2024-07-01 NOTE — Telephone Encounter (Signed)
 Pharmacy Patient Advocate Encounter   Received notification from Patient Advice Request messages that prior authorization for Vumerity  is required/requested.   Insurance verification completed.   The patient is insured through [employer].   Per test claim: PA required; PA submitted to above mentioned insurance via Phone Key/confirmation #/EOC EMPLOYEE ID Status is pending

## 2024-07-01 NOTE — Telephone Encounter (Signed)
 I called Manufacturing Engineer PA line and submitted PA verbally-no PA ID number-they use the PT ID number. States under review and will received determination within 72 hours.  07/01/2024 1:37pm

## 2024-07-02 ENCOUNTER — Telehealth: Payer: Self-pay | Admitting: Pharmacist

## 2024-07-02 ENCOUNTER — Other Ambulatory Visit (HOSPITAL_COMMUNITY): Payer: Self-pay

## 2024-07-02 NOTE — Telephone Encounter (Signed)
 He was denied last year as well and appeal denied. He was on free drug.   Per Dr. Vear: He had issues with tecfidera  and dimethyl fumarate  (insurance) prompting the switch to Vumerity .   This is previous appeal we submitted:

## 2024-07-02 NOTE — Telephone Encounter (Signed)
 Pharmacy Patient Advocate Encounter  Received notification from [facility] that Prior Authorization for Vumerity  has been DENIED.  Full denial letter will be uploaded to the media tab. See denial reason below.   PA #/Case ID/Reference #: [id number]/A

## 2024-07-02 NOTE — Telephone Encounter (Signed)
 Appeal has been submitted for Vumerity . A request for an expedited review was made. Will advise when response.  All information was faxed to [phone] on 07/02/2024 @4 :04 pm.   Thank you, Devere Pandy, PharmD Clinical Pharmacist  [place]  Direct Dial: [phone]

## 2024-07-03 NOTE — Telephone Encounter (Signed)
 Additional information has been requested from the patient's insurance in order to proceed with the appeal request. Requested information has been sent, or form has been filled out and faxed back to [phone]

## 2024-07-06 ENCOUNTER — Other Ambulatory Visit: Payer: Self-pay | Admitting: Neurology

## 2024-07-06 MED ORDER — DIMETHYL FUMARATE 240 MG PO CPDR: DELAYED_RELEASE_CAPSULE | ORAL | 3 refills | Status: AC

## 2024-07-06 NOTE — Telephone Encounter (Signed)
 Spoke with patient and let him know about [facility] for generic Tecfidera . Patient was completely ok with this and will go sign up on their website. His questions were answered and he verbalized appreciation for the call.

## 2024-07-06 NOTE — Telephone Encounter (Signed)
 Insurance denied the appeal for Vumerity . The previous appeal letter was also included in all documents that were sent for this appeal.  Full denial letter can be found under the media tab.

## 2024-07-15 ENCOUNTER — Ambulatory Visit: Admitting: Neurology

## 2024-07-15 ENCOUNTER — Encounter: Payer: Self-pay | Admitting: Neurology

## 2024-07-15 VITALS — BP 154/80 | HR 72 | Ht 71.0 in | Wt 225.0 lb

## 2024-07-15 DIAGNOSIS — Z8669 Personal history of other diseases of the nervous system and sense organs: Secondary | ICD-10-CM | POA: Diagnosis not present

## 2024-07-15 DIAGNOSIS — G35A Relapsing-remitting multiple sclerosis: Secondary | ICD-10-CM

## 2024-07-15 DIAGNOSIS — R5383 Other fatigue: Secondary | ICD-10-CM | POA: Diagnosis not present

## 2024-07-15 DIAGNOSIS — Z79899 Other long term (current) drug therapy: Secondary | ICD-10-CM

## 2024-07-15 DIAGNOSIS — R200 Anesthesia of skin: Secondary | ICD-10-CM | POA: Diagnosis not present

## 2024-07-15 NOTE — Progress Notes (Signed)
 [facility]  PATIENT: Perry Gentry DOB: 30-Jun-1966  REFERRING DOCTOR OR PCP:  Lynwood Null SOURCE: patient and records  _________________________________   HISTORICAL  CHIEF COMPLAINT:  Chief Complaint  Patient presents with   RM10/MS    Pt is here Alone. Pt states he has been stable since last appointment.     HISTORY OF PRESENT ILLNESS:  Perry Gentry is a right handed 58 y.o. man with MS diagnosed in 2014.    Update 07/15/2024: He is on DMF and he tolerates it well.  COpay is better with [facility]. He is stable with no new neurologic issues and no exacerbations.   Last MRI 11/2021 ([facility]) showed no new lesions.   Lymphocytes 10/23/2022 were normal 1.7  He denies any difficulty with gait, balance, strength .A few times with a turn he felt slightly off balanced for a second.  No issues with stairs or long walks.   He has hand numbness but has borderline bilateral CTS.   He sees urology for mild BPH and urinary hesitancy.  He has some urgency at times.   No incontinence.       Takes prn viagra   He does note more issues with his left eye, especially  with exercising.  Vision is doing well.  He denies much fatigue.  Sleep is variable.  Trazodone  increased RLS symptoms.    He works as a systems analyst and travels some.   Cognition is doing well.   He has mild depression.  Pristiq  had not helped and he stopped.      He is not exercising anymore.  Iron and Vit D has been low   MS History:     In his 49s, he had visual changes in the left eye where he felt that was a veil over his vision.   Colors are still desaturated out of the left eye.   In 2010 he noted that when he would bend his neck forward he would have a zinging sensation going down from the spine to both of his hands. Over a couple years this improved.  He was noted numbness in his hands since the middle of 2014. Sometimes, he would drop items.  There was not any pain nor any  weakness.  SABRA He saw orthopedics for his hand numbness in 2014 and had an MRI of the cervical spine. The MRI showed plaque at the C2-C3 region consistent with multiple sclerosis. It also showed degenerative changes at C6-C7 with severe bilateral foraminal stenosis. A subsequent MRI of the brain showed several white matter lesions (2 periventricular, 1-2 cortical foci)  consistent with multiple sclerosis. No abnormal enhancement.   He was referred to Dr. Maree in [place] who diagnosed him with multiple sclerosis.   He was started on Tecfidera .   STUDIES MRI of the cervical spine 2015 showed a spinal cord plaque at C3-C4 more to the left and also has degenerative changes to the right C3-C4 and bilaterally at C6-C7.  MRI of the brain 08/05/2016 shows a few  T2/FLAIR hyperintense foci in the  white matter, most nonspecific.  SABRA  No new lesions compared to 2015.  MRI brain 11/26/17 showed several T2/FLAIR hyperintense foci in the white matter of the hemispheres.  This is a nonspecific finding and could be due to chronic demyelinating plaque associated with multiple sclerosis but could also be due to chronic microvascular ischemic change.  None of the foci appears to be acute.   There is a normal enhancement  pattern and there are no acute findings.   MRI cervical spine 11/26/2017 showed abnormal signal within the spinal cord adjacent to C3-C4.  This is unchanged when compared to the 11/03/2013 MRI     Multilevel degenerative changes as detailed above.  At C3-C4, there is moderately severe right foraminal narrowing that could lead to right C4 nerve root compression.  At C6-C7, there is borderline spinal stenosis and moderately severe left foraminal narrowing that could lead to left C7 nerve root compression.  MRI of the brain 12/14/2021 showed no new lesions compared to the MRI from 2019.  NCV/EMG 12/31/2017 shows Mild left chronic C7 radiculopathy without active features.    Borderline carpal tunnel  syndromes   REVIEW OF SYSTEMS: Constitutional: No fevers, chills, sweats, or change in .   He notes some fatigue Eyes: No visual changes, double vision, eye pain Ear, nose and throat: No hearing loss, ear pain, nasal congestion, sore throat Cardiovascular: No chest pain, palpitations Respiratory:  No shortness of breath at rest or with exertion.   No wheezes GastrointestinaI: No nausea, vomiting, diarrhea, abdominal pain, fecal incontinence Genitourinary:  No dysuria, urinary retention or frequency.  No nocturia.   He has ED and decreased libido Musculoskeletal:  No neck pain, back pain Integumentary: No rash, pruritus, skin lesions Neurological: as above Psychiatric: as above Endocrine: No palpitations, diaphoresis, change in appetite, change in weigh or increased thirst Hematologic/Lymphatic:  No anemia, purpura, petechiae. Allergic/Immunologic: No itchy/runny eyes, nasal congestion, recent allergic reactions, rashes  ALLERGIES: No Known Allergies  HOME MEDICATIONS:  Current Outpatient Medications:    atorvastatin  (LIPITOR) 10 MG tablet, TAKE 1 TABLET BY MOUTH EVERYDAY AT BEDTIME, Disp: 90 tablet, Rfl: 3   cholecalciferol (VITAMIN D ) 1000 UNITS tablet, Take 1,000 Units by mouth daily., Disp: , Rfl:    Dimethyl Fumarate  (TECFIDERA ) 240 MG CPDR, TAKE ONE CAPSULE BY MOUTH TWICE DAILY. STORE IN ORIGINAL CONTAINER AT ROOM TEMPERATURE., Disp: 180 capsule, Rfl: 3   sildenafil  (VIAGRA ) 100 MG tablet, Take 1 tab 1 hour prior to intercourse, Disp: 90 tablet, Rfl: 0   traZODone  (DESYREL ) 50 MG tablet, Take 0.5-1 tablets (25-50 mg total) by mouth at bedtime as needed for sleep. (Patient not taking: Reported on 07/15/2024), Disp: 90 tablet, Rfl: 3  PAST MEDICAL HISTORY: Past Medical History:  Diagnosis Date   Actinic keratoses    face use cream   Basal cell carcinoma    forehead Dr. Dela in 2019    COVID-19    03/05/21   Kidney stones    age 43   Multiple sclerosis    mild sxs in hand  and left eye Dr. Suanne neurology   Vision abnormalities     PAST SURGICAL HISTORY: Past Surgical History:  Procedure Laterality Date   CYSTOSCOPY WITH STENT PLACEMENT     LITHOTRIPSY     age 8 y.o    VASECTOMY      FAMILY HISTORY: Family History  Problem Relation Age of Onset   Hypertension Mother    Hypertension Father    Ankylosing spondylitis Son        age 36     SOCIAL HISTORY:  Social History   Socioeconomic History   Marital status: Married    Spouse name: Not on file   Number of children: Not on file   Years of education: Not on file   Highest education level: Bachelor's degree (e.g., BA, AB, BS)  Occupational History   Not on file  Tobacco Use  Smoking status: Never   Smokeless tobacco: Never  Vaping Use   Vaping status: Never Used  Substance and Sexual Activity   Alcohol use: Yes    Comment: maybe once a month   Drug use: Not Currently   Sexual activity: Yes    Partners: Female  Other Topics Concern   Not on file  Social History Narrative   Lives at home with his wife    Right handed   2 kids 1 son and 1 daughter    DPR wife    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[employer]    Exercises 30 min 4 x per week    Name originates from [place]    Born in [place]     Social Drivers of Health   Tobacco Use: Low Risk (07/15/2024)   Patient History    Smoking Tobacco Use: Never    Smokeless Tobacco Use: Never    Passive Exposure: Not on file  Financial Resource Strain: Low Risk (04/21/2024)   Overall Financial Resource Strain (CARDIA)    Difficulty of Paying Living Expenses: Not hard at all  Food Insecurity: No Food Insecurity (04/21/2024)   Epic    Worried About Radiation Protection Practitioner of Food in the Last Year: Never true    Ran Out of Food in the Last Year: Never true  Transportation Needs: No Transportation Needs (04/21/2024)   Epic    Lack of Transportation (Medical): No    Lack of Transportation (Non-Medical): No  Physical Activity: Inactive  (04/21/2024)   Exercise Vital Sign    Days of Exercise per Week: 0 days    Minutes of Exercise per Session: Not on file  Stress: No Stress Concern Present (04/21/2024)   [employer] of Occupational Health - Occupational Stress Questionnaire    Feeling of Stress: Only a little  Social Connections: Moderately Isolated (04/21/2024)   Social Connection and Isolation Panel    Frequency of Communication with Friends and Family: Three times a week    Frequency of Social Gatherings with Friends and Family: Once a week    Attends Religious Services: Never    Database Administrator or Organizations: No    Attends Engineer, Structural: Not on file    Marital Status: Married  Catering Manager Violence: Not on file  Depression (PHQ2-9): Medium Risk (04/22/2024)   Depression (PHQ2-9)    PHQ-2 Score: 8  Alcohol Screen: Low Risk (04/21/2024)   Alcohol Screen    Last Alcohol Screening Score (AUDIT): 1  Housing: Low Risk (04/21/2024)   Epic    Unable to Pay for Housing in the Last Year: No    Number of Times Moved in the Last Year: 0    Homeless in the Last Year: No  Utilities: Not on file  Health Literacy: Not on file     PHYSICAL EXAM  Vitals:   07/15/24 1101  BP: (!) 154/80  Pulse: 72  SpO2: 99%  Weight: 225 lb (102.1 kg)  Height: 5' 11 (1.803 m)     Body mass index is 31.38 kg/m.   General: The patient is well-developed and well-nourished and in no acute distress   Neurologic Exam  Mental status: The patient is alert and oriented x 3 at the time of the examination. The patient has apparent normal recent and remote memory, with an apparently normal attention span and concentration ability.   Speech is normal.  Cranial nerves: Extraocular movements are full.  He has slightly reduced color vision OS.  Facial strength and sensation is normal.  Trapezius strength is normal no obvious hearing deficits are noted.  Motor:  Muscle bulk is normal.   Muscle tone is normal.   Strength is 5/5.SABRA   Sensory: Sensory testing is intact to touch and vibration in the arms and legs.  Coordination: Cerebellar testing reveals good finger-nose-finger and heel-to-shin bilaterally.  Gait and station: Station is normal.  Gait is normal.  Tandem gait is minimally wide.  No Romberg sign. . Reflexes: Deep tendon reflexes are symmetric and normal bilaterally.        ASSESSMENT AND PLAN  Multiple sclerosis, relapsing-remitting  High risk medication use  Other fatigue  History of optic neuritis  Hand numbness   1.    He is on dimethyl fumarate  and his MS has remained stable.  He denies any exacerbations or new neurologic symptoms.  We will continue for now.  We did discuss that given his age, it is possible that he will be able to come off of disease modifying therapies in the 87s.  1 other strategy would be to have him do 2 years of [employer] as a benefit can often last many years.    2.   Stay active and exercise as tolerated.   3.   Continue OTC vitamin D  supplementation 4.   BP was elevated and he will recheck at home and contact PCP if remains elevated e will return to see me in 6 months if stable but call sooner if he has any significant new or worsening neurologic symptoms   Abena Erdman A. Vear, MD, PhD 07/15/2024, 11:34 AM Certified in Neurology, Clinical Neurophysiology, Sleep Medicine, Pain Medicine and Neuroimaging  [facility] [address] [place], [place] [address] [phone]

## 2024-08-27 ENCOUNTER — Ambulatory Visit: Payer: Self-pay

## 2024-08-27 ENCOUNTER — Encounter: Payer: Self-pay | Admitting: Nurse Practitioner

## 2024-08-27 ENCOUNTER — Ambulatory Visit (INDEPENDENT_AMBULATORY_CARE_PROVIDER_SITE_OTHER): Admitting: Nurse Practitioner

## 2024-08-27 VITALS — BP 142/86 | HR 81 | Temp 98.1°F | Ht 71.0 in | Wt 231.6 lb

## 2024-08-27 DIAGNOSIS — M545 Low back pain, unspecified: Secondary | ICD-10-CM

## 2024-08-27 MED ORDER — METHYLPREDNISOLONE 4 MG PO TBPK: ORAL_TABLET | ORAL | 0 refills | Status: AC

## 2024-08-27 MED ORDER — MELOXICAM 7.5 MG PO TABS: 7.5000 mg | ORAL_TABLET | Freq: Every day | ORAL | 0 refills | Status: AC

## 2024-08-27 NOTE — Telephone Encounter (Signed)
 FYI Only or Action Required?: FYI only for provider: appointment scheduled on 1/29.  Patient was last seen in primary care on 04/22/2024 by Gretel App, NP.  Called Nurse Triage reporting Back Pain.  Symptoms began several weeks ago.  Interventions attempted: Rest, hydration, or home remedies.  Symptoms are: rapidly worsening.  Triage Disposition: See HCP Within 4 Hours (Or PCP Triage)  Patient/caregiver understands and will follow disposition?: Yes        Reason for Disposition  [1] SEVERE back pain (e.g., excruciating, unable to do any normal activities) AND [2] not improved 2 hours after pain medicine  Answer Assessment - Initial Assessment Questions This RN recommended pt be examined in next 4 hours, scheduled for earliest available with PCP office this afternoon. Advised call back or seek immediate care if new or worsening symptoms.     1. ONSET: When did the pain begin? (e.g., minutes, hours, days)     Might have started around Christmas, lifting bins But recently become much much worse, not sure what I did to make it worse, past 3 days bad, this morning was excruciating  2. LOCATION: Where does it hurt? (upper, mid or lower back)     Lower left back/hip area only  3. SEVERITY: How bad is the pain?  (e.g., Scale 1-10; mild, moderate, or severe)     Zings and goes away Takes my breath away, have to grab onto something Zings are sometimes minor and sometimes take breath away When zings me, can't do anything for that moment, instantaneous thing Feel like old man trying to get around  4. PATTERN: Is the pain constant? (e.g., yes, no; constant, intermittent)      Not all the time, intermittent Sitting is okay, sleep fine, laying and turning fine Being upright, weight bearing is painful Can't do anything necessarily to make it happen, Can kind of reproduce the pain but not that zing  5. RADIATION: Does the pain shoot into your legs or somewhere else?      Denies  6. CAUSE:  What do you think is causing the back pain?      Thought at first pulled something in back, but think may be a nerve  9. NEUROLOGIC SYMPTOMS: Do you have any weakness, numbness, or problems with bowel/bladder control?     Denies  10. OTHER SYMPTOMS: Do you have any other symptoms? (e.g., fever, abdomen pain, burning with urination, blood in urine)       Denies  Denies: Passing out Cold/clammy skin, too weak to stand Weakness Numbness or tingling Upper back or chest pain Flank pain Urinary changes Changes to bowel/bladder control  Protocols used: Back Pain-A-AH

## 2024-08-27 NOTE — Progress Notes (Signed)
 "  Established Patient Office Visit  Subjective:  Patient ID: Perry Gentry, male    DOB: [birth year]  Age: 59 y.o. MRN: [id number]  CC:  Chief Complaint  Patient presents with   Acute Visit    Zinging pain in left hip & back x 3 days  Pain 8/10   Discussed the use of AI scribe software for clinical note transcription with the patient, who gave verbal consent to proceed.  History of Present Illness  History of Present Illness   Perry Gentry is a 59 year old male who presents with lower left-sided back pain.  He has had sharp, brief zinging pain in the left lower back for three days. It is intermittent, triggered by standing, walking, or bending, and absent while sitting or lying. Occasionally it radiates to the front of his hip without going down the leg.  He takes Aleve twice daily and uses a heating pad, but the pain has worsened over three days. Symptoms ease after sitting still for about 1 to 1.5 hours. He denies urinary symptoms, abdominal pain, or kidney stone-like symptoms despite past kidney stones.  He denies numbness, tingling, or persistent leg weakness, though the pain can cause a momentary sensation of weakness. He has not had this type of pain before. His work is mostly desk-based. He has no sleep disturbance or nighttime pain.     Past Medical History:  Diagnosis Date   Actinic keratoses    face use cream   Basal cell carcinoma    forehead Dr. Dela in 2019    COVID-19    03/05/21   Kidney stones    age 62   Multiple sclerosis    mild sxs in hand and left eye Dr. Suanne neurology   Vision abnormalities     Past Surgical History:  Procedure Laterality Date   CYSTOSCOPY WITH STENT PLACEMENT     LITHOTRIPSY     age 89 y.o    VASECTOMY      Family History  Problem Relation Age of Onset   Hypertension Mother    Hypertension Father    Ankylosing spondylitis Son        age 80     Social History   Socioeconomic History   Marital status:  Married    Spouse name: Not on file   Number of children: Not on file   Years of education: Not on file   Highest education level: Bachelor's degree (e.g., BA, AB, BS)  Occupational History   Not on file  Tobacco Use   Smoking status: Never   Smokeless tobacco: Never  Vaping Use   Vaping status: Never Used  Substance and Sexual Activity   Alcohol use: Yes    Comment: maybe once a month   Drug use: Not Currently   Sexual activity: Yes    Partners: Female  Other Topics Concern   Not on file  Social History Narrative   Lives at home with his wife    Right handed   2 kids 1 son and 1 daughter    DPR wife    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[employer]    Exercises 30 min 4 x per week    Name originates from [place]    Born in [place]     Social Drivers of Health   Tobacco Use: Low Risk (08/27/2024)   Patient History    Smoking Tobacco Use: Never    Smokeless Tobacco Use: Never    Passive Exposure: Not  on file  Financial Resource Strain: Low Risk (04/21/2024)   Overall Financial Resource Strain (CARDIA)    Difficulty of Paying Living Expenses: Not hard at all  Food Insecurity: No Food Insecurity (04/21/2024)   Epic    Worried About Programme Researcher, Broadcasting/film/video in the Last Year: Never true    Ran Out of Food in the Last Year: Never true  Transportation Needs: No Transportation Needs (04/21/2024)   Epic    Lack of Transportation (Medical): No    Lack of Transportation (Non-Medical): No  Physical Activity: Inactive (04/21/2024)   Exercise Vital Sign    Days of Exercise per Week: 0 days    Minutes of Exercise per Session: Not on file  Stress: No Stress Concern Present (04/21/2024)   [employer] of Occupational Health - Occupational Stress Questionnaire    Feeling of Stress: Only a little  Social Connections: Moderately Isolated (04/21/2024)   Social Connection and Isolation Panel    Frequency of Communication with Friends and Family: Three times a week    Frequency of  Social Gatherings with Friends and Family: Once a week    Attends Religious Services: Never    Database Administrator or Organizations: No    Attends Engineer, Structural: Not on file    Marital Status: Married  Catering Manager Violence: Not on file  Depression (PHQ2-9): Low Risk (08/27/2024)   Depression (PHQ2-9)    PHQ-2 Score: 4  Alcohol Screen: Low Risk (04/21/2024)   Alcohol Screen    Last Alcohol Screening Score (AUDIT): 1  Housing: Low Risk (04/21/2024)   Epic    Unable to Pay for Housing in the Last Year: No    Number of Times Moved in the Last Year: 0    Homeless in the Last Year: No  Utilities: Not on file  Health Literacy: Not on file     Outpatient Medications Prior to Visit  Medication Sig Dispense Refill   atorvastatin  (LIPITOR) 10 MG tablet TAKE 1 TABLET BY MOUTH EVERYDAY AT BEDTIME 90 tablet 3   cholecalciferol (VITAMIN D ) 1000 UNITS tablet Take 1,000 Units by mouth daily.     Dimethyl Fumarate  (TECFIDERA ) 240 MG CPDR TAKE ONE CAPSULE BY MOUTH TWICE DAILY. STORE IN ORIGINAL CONTAINER AT ROOM TEMPERATURE. 180 capsule 3   sildenafil  (VIAGRA ) 100 MG tablet Take 1 tab 1 hour prior to intercourse 90 tablet 0   traZODone  (DESYREL ) 50 MG tablet Take 0.5-1 tablets (25-50 mg total) by mouth at bedtime as needed for sleep. (Patient not taking: Reported on 07/15/2024) 90 tablet 3   No facility-administered medications prior to visit.    Allergies[1]  ROS Review of Systems  Musculoskeletal:  Positive for back pain.  Neurological:  Negative for weakness.   Negative unless indicated in HPI.    Objective:    Physical Exam Constitutional:      Appearance: Normal appearance.  HENT:     Head: Normocephalic.  Cardiovascular:     Rate and Rhythm: Normal rate and regular rhythm.     Pulses: Normal pulses.     Heart sounds: Normal heart sounds. No murmur heard. Pulmonary:     Effort: Pulmonary effort is normal.     Breath sounds: Normal breath sounds. No  stridor. No wheezing.  Abdominal:     General: Bowel sounds are normal.     Palpations: Abdomen is soft. There is no mass.     Tenderness: There is no abdominal tenderness. There is no right CVA  tenderness or left CVA tenderness.  Musculoskeletal:        General: No swelling, tenderness or signs of injury.  Neurological:     Mental Status: He is alert.     BP (!) 142/86   Pulse 81   Temp 98.1 F (36.7 C)   Ht 5' 11 (1.803 m)   Wt 231 lb 9.6 oz (105.1 kg)   SpO2 98%   BMI 32.30 kg/m  Wt Readings from Last 3 Encounters:  08/27/24 231 lb 9.6 oz (105.1 kg)  07/15/24 225 lb (102.1 kg)  04/22/24 223 lb (101.2 kg)     Health Maintenance  Topic Date Due   HIV Screening  Never done   Hepatitis C Screening  Never done   DTaP/Tdap/Td (1 - Tdap) Never done   Hepatitis B Vaccines 19-59 Average Risk (1 of 3 - 19+ 3-dose series) Never done   Zoster Vaccines- Shingrix  (1 of 2) Never done   Colonoscopy  Never done   Pneumococcal Vaccine: 50+ Years (1 of 1 - PCV) Never done   COVID-19 Vaccine (4 - 2025-26 season) 03/30/2024   Influenza Vaccine  10/27/2024 (Originally 02/28/2024)   HPV VACCINES (No Doses Required) Completed   Meningococcal B Vaccine  Aged Out       Topic Date Due   Hepatitis B Vaccines 19-59 Average Risk (1 of 3 - 19+ 3-dose series) Never done    Lab Results  Component Value Date   TSH 1.97 04/23/2024   Lab Results  Component Value Date   WBC 5.7 04/23/2024   HGB 15.0 04/23/2024   HCT 44.1 04/23/2024   MCV 84.8 04/23/2024   PLT 150.0 04/23/2024   Lab Results  Component Value Date   NA 142 04/23/2024   K 4.6 04/23/2024   CO2 28 04/23/2024   GLUCOSE 92 04/23/2024   BUN 13 04/23/2024   CREATININE 1.04 04/23/2024   BILITOT 0.5 04/23/2024   ALKPHOS 68 04/23/2024   AST 16 04/23/2024   ALT 28 04/23/2024   PROT 7.3 04/23/2024   ALBUMIN 4.6 04/23/2024   CALCIUM  9.8 04/23/2024   GFR 78.78 04/23/2024   Lab Results  Component Value Date   CHOL 162  04/23/2024   Lab Results  Component Value Date   HDL 48.90 04/23/2024   Lab Results  Component Value Date   LDLCALC 96 04/23/2024   Lab Results  Component Value Date   TRIG 87.0 04/23/2024   Lab Results  Component Value Date   CHOLHDL 3 04/23/2024   Lab Results  Component Value Date   HGBA1C 5.9 04/23/2024      Assessment & Plan:   Assessment & Plan Acute left-sided low back pain without sciatica Intermittent sharp pain in left lower back.  Negative straight leg raise.  Denies saddle anesthesia, urinary, abdominal, or neurological symptoms. Pain increased over three days. - Will treat with Medrol  Dosepak and meloxicam  7.5 mg as needed - Advised Tylenol if pain persists after meloxicam . - Continue heat therapy. - Report if no improvement.       Follow-up: Return if symptoms worsen or fail to improve.   Chelsea Aurora, NP     [1] No Known Allergies  "

## 2024-08-29 DIAGNOSIS — M545 Low back pain, unspecified: Secondary | ICD-10-CM | POA: Insufficient documentation

## 2024-08-29 NOTE — Assessment & Plan Note (Addendum)
 Intermittent sharp pain in left lower back.  Negative straight leg raise.  Denies saddle anesthesia, urinary, abdominal, or neurological symptoms. Pain increased over three days. - Will treat with Medrol  Dosepak and meloxicam  7.5 mg as needed - Advised Tylenol if pain persists after meloxicam . - Continue heat therapy. - Report if no improvement.

## 2025-03-25 ENCOUNTER — Ambulatory Visit: Admitting: Neurology

## 2025-04-28 ENCOUNTER — Encounter: Admitting: Nurse Practitioner
